# Patient Record
Sex: Female | Born: 1975 | Hispanic: Yes | Marital: Married | Smoking: Never smoker
Health system: Southern US, Community
[De-identification: ages and names within clinical notes are randomized; demographics above are authoritative.]

## PROBLEM LIST (undated history)

## (undated) DIAGNOSIS — IMO0002 Reserved for concepts with insufficient information to code with codable children: Secondary | ICD-10-CM

## (undated) DIAGNOSIS — O09529 Supervision of elderly multigravida, unspecified trimester: Secondary | ICD-10-CM

## (undated) DIAGNOSIS — I8390 Asymptomatic varicose veins of unspecified lower extremity: Secondary | ICD-10-CM

## (undated) DIAGNOSIS — M1990 Unspecified osteoarthritis, unspecified site: Secondary | ICD-10-CM

## (undated) DIAGNOSIS — E669 Obesity, unspecified: Secondary | ICD-10-CM

## (undated) DIAGNOSIS — E785 Hyperlipidemia, unspecified: Secondary | ICD-10-CM

## (undated) HISTORY — DX: Obesity, unspecified: E66.9

## (undated) HISTORY — DX: Asymptomatic varicose veins of unspecified lower extremity: I83.90

## (undated) HISTORY — DX: Reserved for concepts with insufficient information to code with codable children: IMO0002

## (undated) HISTORY — DX: Supervision of elderly multigravida, unspecified trimester: O09.529

---

## 2003-08-01 ENCOUNTER — Emergency Department (HOSPITAL_COMMUNITY): Admission: EM | Admit: 2003-08-01 | Discharge: 2003-08-01 | Payer: Self-pay | Admitting: Emergency Medicine

## 2003-12-14 ENCOUNTER — Encounter (INDEPENDENT_AMBULATORY_CARE_PROVIDER_SITE_OTHER): Payer: Self-pay | Admitting: *Deleted

## 2003-12-14 ENCOUNTER — Encounter: Admission: RE | Admit: 2003-12-14 | Discharge: 2003-12-14 | Payer: Self-pay | Admitting: Obstetrics and Gynecology

## 2003-12-14 ENCOUNTER — Other Ambulatory Visit: Admission: RE | Admit: 2003-12-14 | Discharge: 2003-12-14 | Payer: Self-pay | Admitting: Obstetrics & Gynecology

## 2003-12-14 ENCOUNTER — Encounter (INDEPENDENT_AMBULATORY_CARE_PROVIDER_SITE_OTHER): Payer: Self-pay | Admitting: Specialist

## 2004-07-07 ENCOUNTER — Encounter: Admission: RE | Admit: 2004-07-07 | Discharge: 2004-07-07 | Payer: Self-pay | Admitting: Family Medicine

## 2004-07-07 ENCOUNTER — Encounter (INDEPENDENT_AMBULATORY_CARE_PROVIDER_SITE_OTHER): Payer: Self-pay | Admitting: *Deleted

## 2005-05-05 ENCOUNTER — Inpatient Hospital Stay (HOSPITAL_COMMUNITY): Admission: AD | Admit: 2005-05-05 | Discharge: 2005-05-05 | Payer: Self-pay | Admitting: Obstetrics and Gynecology

## 2005-05-07 ENCOUNTER — Inpatient Hospital Stay (HOSPITAL_COMMUNITY): Admission: AD | Admit: 2005-05-07 | Discharge: 2005-05-07 | Payer: Self-pay | Admitting: *Deleted

## 2005-06-18 ENCOUNTER — Inpatient Hospital Stay (HOSPITAL_COMMUNITY): Admission: AD | Admit: 2005-06-18 | Discharge: 2005-06-18 | Payer: Self-pay | Admitting: Obstetrics & Gynecology

## 2005-09-04 ENCOUNTER — Ambulatory Visit (HOSPITAL_COMMUNITY): Admission: RE | Admit: 2005-09-04 | Discharge: 2005-09-04 | Payer: Self-pay | Admitting: Obstetrics and Gynecology

## 2005-11-28 ENCOUNTER — Ambulatory Visit (HOSPITAL_COMMUNITY): Admission: RE | Admit: 2005-11-28 | Discharge: 2005-11-28 | Payer: Self-pay | Admitting: Obstetrics and Gynecology

## 2006-01-03 ENCOUNTER — Ambulatory Visit: Payer: Self-pay | Admitting: Obstetrics & Gynecology

## 2006-01-09 ENCOUNTER — Ambulatory Visit: Payer: Self-pay | Admitting: Obstetrics & Gynecology

## 2006-01-09 ENCOUNTER — Inpatient Hospital Stay (HOSPITAL_COMMUNITY): Admission: AD | Admit: 2006-01-09 | Discharge: 2006-01-12 | Payer: Self-pay | Admitting: Obstetrics and Gynecology

## 2006-01-10 ENCOUNTER — Encounter (INDEPENDENT_AMBULATORY_CARE_PROVIDER_SITE_OTHER): Payer: Self-pay | Admitting: Specialist

## 2006-01-15 ENCOUNTER — Ambulatory Visit: Payer: Self-pay | Admitting: *Deleted

## 2007-06-12 ENCOUNTER — Emergency Department (HOSPITAL_COMMUNITY): Admission: EM | Admit: 2007-06-12 | Discharge: 2007-06-12 | Payer: Self-pay | Admitting: Emergency Medicine

## 2008-12-06 ENCOUNTER — Emergency Department (HOSPITAL_COMMUNITY): Admission: EM | Admit: 2008-12-06 | Discharge: 2008-12-06 | Payer: Self-pay | Admitting: Emergency Medicine

## 2011-04-06 NOTE — Op Note (Signed)
NAME:  Mindy Johnson, Mindy Johnson      ACCOUNT NO.:  [id number]   MEDICAL RECORD NO.:  [id number]          PATIENT TYPE:  INP   LOCATION:  9102                          FACILITY:  [facility]   PHYSICIAN:  Lesly Dukes, M.D. DATE OF BIRTH:  [birth year]   DATE OF PROCEDURE:  01/10/2006  DATE OF DISCHARGE:                                 OPERATIVE REPORT   PREOPERATIVE DIAGNOSES:  1.  Forty-one weeks and five days gestational age intrauterine pregnancy.  2.  Failure to progress.  3.  Non-reassuring tracing when on Pitocin.   POSTOPERATIVE DIAGNOSES:  1.  Forty-one weeks and five days gestational age intrauterine pregnancy.  2.  Failure to progress.  3.  Non-reassuring tracing when on Pitocin.   OPERATION/PROCEDURE:  Primary low flap transverse cesarean section.   SURGEON:  Lesly Dukes, M.D.   ASSISTANT:  Marc Morgans. Mayford Knife, M.D.   ESTIMATED BLOOD LOSS:  800 mL.   COMPLICATIONS:  None.   PATHOLOGY:  None.   FINDINGS:  Viable female infant, Apgars 9 at one minute and 9 at five  minutes, thin meconium, ROP presentation, grossly normal fallopian tube,  ovaries and uterus.  Weight was 8 pounds 9 ounces.  Arterial cord PH 7.28.   DESCRIPTION OF PROCEDURE:  After informed consent was obtained, the patient  was taken to the operating room where epidural anesthesia was adequate.  The  patient was placed in the dorsal supine position with a leftward tilt and  prepped and draped in the normal sterile fashion.  The Foley was already in  the bladder.   A Pfannenstiel skin incision was made with the scalpel and carried down to  the underlying layer of fascia.  The fascia was incised in the midline and  this incision was extended bilaterally with the Mayo scissors.  The superior  and inferior aspects of the fascial incision were grasped with Kocher  clamps, tented up, dissected off sharply and bluntly from underlying layers  of rectus muscles.  The rectus muscles were separated in the  midline.  The  peritoneum was identified and entered bluntly and this incision was extended  both superior and inferiorly with good visualization of the bladder.  The  bladder blade was inserted.  The vesicouterine peritoneum was identified,  tented up and entered sharply with the Metzenbaum scissors.  This incision  was extended bilaterally and the bladder flap was created digitally.  Bladder blade was reinserted.  The uterine incision was made in a transverse  fashion in the lower uterine segment.  This incision was extended  bilaterally bluntly.  The head was delivered without incident and the nose  and mouth were suctioned with the DeLee.  The rest of the baby's body  delivered easily.  The cord was clamped and cut and the baby was handed off  to the waiting pediatrician.  Cord blood was sent for type and screen and a  cord arterial blood gas was also sent.  Placenta delivered manually  spontaneously with three-vessel cord.  The uterus was cleared of all clots  and debris and closed with 0 Vicryl in a running locked fashion.  A second  suture was placed over some parts of the uterus for reinforcement and for  hemostasis.  The abdomen was copiously irrigated and the uterus was found to  be hemostatic off tension.  Rectus muscles and peritoneum were noted to be  hemostatic.  The fascia was closed with 0 Vicryl in a running fashion.  Good  hemostasis was noted.  The subcutaneous tissue was copiously irrigated and  found to be hemostatic.  The skin was closed with staples.  The patient  tolerated the procedure well.  Sponge, lab, instrument, and needle counts  correct x2.  The patient went to the recovery room in stable condition.           ______________________________  Lesly Dukes, M.D.     KHL/MEDQ  D:  01/10/2006  T:  01/11/2006  Job:  [id number]

## 2011-04-06 NOTE — Discharge Summary (Signed)
Mindy Johnson, Mindy Johnson      ACCOUNT NO.:  [id number]   MEDICAL RECORD NO.:  [id number]          PATIENT TYPE:  INP   LOCATION:  9102                          FACILITY:  [facility]   PHYSICIAN:  Benn Moulder, M.D.      DATE OF BIRTH:  1976/02/18   DATE OF ADMISSION:  01/09/2006  DATE OF DISCHARGE:  01/12/2006                                 DISCHARGE SUMMARY   ADMISSION DIAGNOSES:  1.  Intrauterine pregnancy at 79 and 1/[redacted] weeks gestational age.  2.  Induction of labor for post dates.   DISCHARGE DIAGNOSES:  1.  Intrauterine pregnancy at 41 and 5/7 weeks.  2.  Status post primary low-transverse cesarean section.  3.  Failure to progress.  4.  Non-reassuring tracing when on Pitocin.   OPERATIONS AND PROCEDURES:  A primary low-transverse cesarean section on  January 10, 2006.   LABORATORY DATA:  CBC on admission:  White blood cell count 12.7, hemoglobin  13.3 and hematocrit 39.7.  CBC on discharge:  White blood cell count 24.1,  hemoglobin 11.2, hematocrit 32.7 and platelet count 241.  RPR was  nonreactive.   HOSPITAL COURSE:  The patient is a 35 year old gravida 2, para 0-0-1-0, who  presented at 73 and 4/7 weeks for induction of labor secondary to post  dates.  Induction was started with Cytotec and Pitocin.  The patient did  develop fever and was started on ampicillin and gentamicin for  chorioamnionitis.  The patient's labor failed to progress and the infant  also developed non-reassuring fetal heart tones while on Pitocin, so the  patient was taken to the OR for primary low-transverse cesarean section.  A  viable female with Apgars of 9 at one minute and 9 at five minutes with  delivered via primary low-transverse cesarean section under epidural  anesthesia.  Estimated blood loss was 800 mL.  There were no complications.  A 3-vessel cord placenta was delivered manually.  Arterial cord pH was 7.28.  Mom is breastfeeding and received a Depo shot prior to discharge for  contraception.  She was maintained on gentamicin and clindamycin for 24  hours postoperatively, but was without fever postoperatively.  Mom is O  positive, rubella immune and was GBS negative.   DISCHARGE MEDICATIONS:  1.  Percocet 5/325, 1 p.o. q.6h. p.r.n. pain.  2.  Ibuprofen 600 mg p.o. q.6h. p.r.n. pain.  3.  Prenatal vitamins once daily while breastfeeding.   DISCHARGE INSTRUCTIONS:  The patient is to increase activity slowly.   DISCHARGE FOLLOWUP:  1.  The patient is to return to the MAU in 2-3 days for staple removal.  2.  The patient is to follow up in 6 weeks at [facility] for a routine      postpartum visit.      Benn Moulder, M.D.     MR/MEDQ  D:  01/12/2006  T:  01/12/2006  Job:  [id number]

## 2011-06-29 ENCOUNTER — Other Ambulatory Visit: Payer: Self-pay | Admitting: Family Medicine

## 2011-06-29 DIAGNOSIS — N6311 Unspecified lump in the right breast, upper outer quadrant: Secondary | ICD-10-CM

## 2011-07-05 ENCOUNTER — Ambulatory Visit
Admission: RE | Admit: 2011-07-05 | Discharge: 2011-07-05 | Disposition: A | Discharge status: Home / Self Care | Payer: Self-pay | Source: Ambulatory Visit | Attending: Family Medicine | Admitting: Family Medicine

## 2011-07-05 DIAGNOSIS — N6311 Unspecified lump in the right breast, upper outer quadrant: Secondary | ICD-10-CM

## 2011-09-03 LAB — POCT PREGNANCY, URINE
Operator id: 247071
Preg Test, Ur: NEGATIVE

## 2011-09-03 LAB — ACETAMINOPHEN LEVEL: Acetaminophen (Tylenol), Serum: <10 — ABNORMAL LOW

## 2012-01-17 ENCOUNTER — Other Ambulatory Visit: Payer: Self-pay

## 2012-01-17 ENCOUNTER — Encounter (HOSPITAL_COMMUNITY): Payer: Self-pay | Admitting: *Deleted

## 2012-01-17 ENCOUNTER — Emergency Department (INDEPENDENT_AMBULATORY_CARE_PROVIDER_SITE_OTHER)
Admission: EM | Admit: 2012-01-17 | Discharge: 2012-01-17 | Disposition: A | Discharge status: Home / Self Care | Payer: Self-pay | Source: Home / Self Care | Attending: Family Medicine | Admitting: Family Medicine

## 2012-01-17 DIAGNOSIS — R071 Chest pain on breathing: Secondary | ICD-10-CM

## 2012-01-17 DIAGNOSIS — R0789 Other chest pain: Secondary | ICD-10-CM

## 2012-01-17 MED ORDER — IBUPROFEN 800 MG PO TABS: 800.0000 mg | ORAL_TABLET | Freq: Three times a day (TID) | ORAL | Status: AC

## 2012-01-17 NOTE — ED Notes (Signed)
Pt  Reports   l  Sided  Chest  Pain   With  Shortness of breath   She  Reports  The  Pain is  Worse  When  She  Takes a  Deep  Breath        She  Is  Awake  As  Well as  Alert  And  Oriented        Skin is warm /  Dry          Speaking in complete  sentances       Reports  She  Has  Had  Similar  Episode  In past  Yet  Became    Worried

## 2012-01-17 NOTE — ED Provider Notes (Signed)
History     CSN: [id number]  Arrival date & time 01/17/12  1021   First MD Initiated Contact with Patient 01/17/12 1113      Chief Complaint  Patient presents with  . Chest Pain    (Consider location/radiation/quality/duration/timing/severity/associated sxs/prior treatment) HPI Comments: The patient reports awakened from sleep with left sided chest pain and pain into her left arm. No shortness of breath but pain increases with deep breathing . No cough or cold symptoms. No injury. No n/v. No treatment pta. No hx of heart or lung problems.   The history is provided by the patient.    History reviewed. No pertinent past medical history.  History reviewed. No pertinent past surgical history.  History reviewed. No pertinent family history.  History  Substance Use Topics  . Smoking status: Not on file  . Smokeless tobacco: Not on file  . Alcohol Use: No    OB History    Grav Para Term Preterm Abortions TAB SAB Ect Mult Living                  Review of Systems  Constitutional: Negative.   HENT: Negative.   Eyes: Negative.   Respiratory: Negative.   Cardiovascular: Positive for chest pain. Negative for palpitations and leg swelling.  Gastrointestinal: Negative.   Genitourinary: Negative.   Musculoskeletal: Negative.   Neurological: Negative.     Allergies  Review of patient's allergies indicates not on file.  Home Medications   Current Outpatient Rx  Name Route Sig Dispense Refill  . IBUPROFEN 800 MG PO TABS Oral Take 1 tablet (800 mg total) by mouth 3 (three) times daily. 21 tablet 0    BP 111/75  Pulse 76  Temp(Src) 97.9 F (36.6 C) (Oral)  Resp 14  SpO2 99%  Physical Exam  Nursing note and vitals reviewed. Constitutional: She is oriented to person, place, and time. She appears well-developed and well-nourished. No distress.  HENT:  Head: Normocephalic and atraumatic.  Mouth/Throat: No oropharyngeal exudate.  Neck: Normal range of motion. Neck  supple.  Cardiovascular: Normal rate and regular rhythm.        Chest wall pain to palpation both ant and post  Pulmonary/Chest: Effort normal and breath sounds normal.  Lymphadenopathy:    She has no cervical adenopathy.  Neurological: She is alert and oriented to person, place, and time.  Skin: Skin is warm and dry.    ED Course  Procedures (including critical care time)  Labs Reviewed - No data to display No results found.   1. Chest wall pain       MDM          Randa Spike, MD 01/17/12 [phone]

## 2012-12-23 ENCOUNTER — Inpatient Hospital Stay (HOSPITAL_COMMUNITY)
Admission: AD | Admit: 2012-12-23 | Discharge: 2012-12-23 | Disposition: A | Discharge status: Home / Self Care | Payer: Self-pay | Source: Ambulatory Visit | Attending: Family Medicine | Admitting: Family Medicine

## 2012-12-23 ENCOUNTER — Encounter (HOSPITAL_COMMUNITY): Payer: Self-pay

## 2012-12-23 DIAGNOSIS — N949 Unspecified condition associated with female genital organs and menstrual cycle: Secondary | ICD-10-CM

## 2012-12-23 DIAGNOSIS — O0930 Supervision of pregnancy with insufficient antenatal care, unspecified trimester: Secondary | ICD-10-CM | POA: Insufficient documentation

## 2012-12-23 DIAGNOSIS — O99891 Other specified diseases and conditions complicating pregnancy: Secondary | ICD-10-CM

## 2012-12-23 DIAGNOSIS — R42 Dizziness and giddiness: Secondary | ICD-10-CM | POA: Insufficient documentation

## 2012-12-23 DIAGNOSIS — K5900 Constipation, unspecified: Secondary | ICD-10-CM | POA: Insufficient documentation

## 2012-12-23 DIAGNOSIS — O210 Mild hyperemesis gravidarum: Secondary | ICD-10-CM

## 2012-12-23 DIAGNOSIS — M549 Dorsalgia, unspecified: Secondary | ICD-10-CM

## 2012-12-23 DIAGNOSIS — O219 Vomiting of pregnancy, unspecified: Secondary | ICD-10-CM

## 2012-12-23 HISTORY — DX: Hyperlipidemia, unspecified: E78.5

## 2012-12-23 LAB — URINALYSIS, ROUTINE W REFLEX MICROSCOPIC
Bilirubin Urine: NEGATIVE
Glucose, UA: NEGATIVE mg/dL
Hgb urine dipstick: NEGATIVE
Ketones, ur: NEGATIVE mg/dL
Leukocytes, UA: NEGATIVE
Nitrite: NEGATIVE
Protein, ur: NEGATIVE mg/dL
Specific Gravity, Urine: 1.015 (ref 1.005–1.030)
Urobilinogen, UA: 0.2 mg/dL (ref 0.0–1.0)
pH: 6 (ref 5.0–8.0)

## 2012-12-23 LAB — WET PREP, GENITAL: Yeast Wet Prep HPF POC: NONE SEEN

## 2012-12-23 MED ORDER — PROMETHAZINE HCL 12.5 MG PO TABS: 12.5000 mg | ORAL_TABLET | Freq: Four times a day (QID) | ORAL | Status: DC | PRN

## 2012-12-23 MED ORDER — ONDANSETRON HCL 4 MG PO TABS: 4.0000 mg | ORAL_TABLET | Freq: Four times a day (QID) | ORAL | Status: DC

## 2012-12-23 MED ORDER — ONDANSETRON 8 MG PO TBDP
8.0000 mg | ORAL_TABLET | Freq: Once | ORAL | Status: AC
  Administered 2012-12-23: 8 mg via ORAL
  Filled 2012-12-23: qty 1

## 2012-12-23 NOTE — MAU Note (Signed)
Pt states here for dizziness and pain since last pm, had trouble sleeping. Notes lower abd pain as well as leg pain, notes pain with voiding but no burning or pressure. Denies abnormal vaginal discharge or bleeding.

## 2012-12-23 NOTE — MAU Provider Note (Signed)
Chart reviewed and agree with management and plan.  

## 2012-12-23 NOTE — MAU Note (Signed)
Pt has appt on 2/11 with [facility].

## 2012-12-23 NOTE — MAU Provider Note (Signed)
History     CSN: [id number]  Arrival date and time: 12/23/12 1044   First Provider Initiated Contact with Patient 12/23/12 1224      Chief Complaint  Patient presents with  . Dizziness  . Abdominal Pain   HPI [name] is a 37 y.o. G2P1001 at [redacted]w[redacted]d who presents to MAU today with complaint of pelvic pain x 1 week and back pain that started yesterday. The patient states that the pelvic pain is intermittent. She also states that she is constipated. She has had N/V recently, the most recent episode of which was last night. She has some mild epigastric discomfort. She does have occasional dizziness as well. She denies UTI symptoms, vaginal bleeding, abnormal discharge or fever. She has an appointment to start prenatal care at [facility] on 01/01/13.   OB History    Grav Para Term Preterm Abortions TAB SAB Ect Mult Living   2 1 1       1       Past Medical History  Diagnosis Date  . Hyperlipidemia     Past Surgical History  Procedure Date  . Cesarean section     History reviewed. No pertinent family history.  History  Substance Use Topics  . Smoking status: Never Smoker   . Smokeless tobacco: Not on file  . Alcohol Use: No    Allergies: No Known Allergies  Prescriptions prior to admission  Medication Sig Dispense Refill  . Prenatal Vit-Fe Fumarate-FA (PRENATAL MULTIVITAMIN) TABS Take 1 tablet by mouth daily.        Review of Systems  Constitutional: Negative for fever and chills.  Respiratory: Negative for shortness of breath.   Cardiovascular: Negative for chest pain and palpitations.  Gastrointestinal: Positive for nausea, vomiting, abdominal pain and constipation. Negative for heartburn and diarrhea.  Genitourinary: Negative for dysuria, urgency and frequency.  Musculoskeletal: Positive for back pain.  Neurological: Positive for dizziness. Negative for tingling and headaches.   Physical Exam   Blood pressure 103/63, pulse 66, temperature 97.8 F (36.6 C),  temperature source Oral, resp. rate 16.  Physical Exam  Constitutional: She is oriented to person, place, and time. She appears well-developed and well-nourished. No distress.  HENT:  Head: Normocephalic.  Cardiovascular: Normal rate, regular rhythm and normal heart sounds.   Respiratory: Effort normal and breath sounds normal. No respiratory distress.  GI: Soft. Bowel sounds are normal. She exhibits no distension and no mass. There is tenderness (mild tenderness to palpation of the epigastric region and lower abdomen bilaterally). There is no rebound and no guarding.  Genitourinary: Vagina normal. Uterus is enlarged. Uterus is not tender. Cervix exhibits discharge (small amount of thin white discharge). Cervix exhibits no motion tenderness and no friability.  Neurological: She is alert and oriented to person, place, and time.  Skin: Skin is warm and dry. No erythema.  Psychiatric: She has a normal mood and affect.   Results for orders placed during the hospital encounter of 12/23/12 (from the past 24 hour(s))  URINALYSIS, ROUTINE W REFLEX MICROSCOPIC     Status: Normal   Collection Time   12/23/12 11:15 AM      Component Value Range   Color, Urine YELLOW  YELLOW   APPearance CLEAR  CLEAR   Specific Gravity, Urine 1.015  1.005 - 1.030   pH 6.0  5.0 - 8.0   Glucose, UA NEGATIVE  NEGATIVE mg/dL   Hgb urine dipstick NEGATIVE  NEGATIVE   Bilirubin Urine NEGATIVE  NEGATIVE  Ketones, ur NEGATIVE  NEGATIVE mg/dL   Protein, ur NEGATIVE  NEGATIVE mg/dL   Urobilinogen, UA 0.2  0.0 - 1.0 mg/dL   Nitrite NEGATIVE  NEGATIVE   Leukocytes, UA NEGATIVE  NEGATIVE  WET PREP, GENITAL     Status: Abnormal   Collection Time   12/23/12 12:34 PM      Component Value Range   Yeast Wet Prep HPF POC NONE SEEN  NONE SEEN   Trich, Wet Prep NONE SEEN  NONE SEEN   Clue Cells Wet Prep HPF POC NONE SEEN  NONE SEEN   WBC, Wet Prep HPF POC FEW (*) NONE SEEN    MAU Course  Procedures  MDM UA and Wet prep  obtained - WNL  Patient is most likely experiencing round ligament pain Epigastric tenderness is most likely due to N/V  Assessment and Plan  A: Nausea and vomiting in pregnancy Round ligament pain Late prenatal care  P: Discharge home Patient to keep appointment to start prenatal care at [facility] next week Tylenol as needed for pain; warm baths and heating pad may also be helpful for discomfort Rx for Phenergan and Zofran sent to patient's pharmacy Encouraged increased PO hydration as tolerated.  Patient may return to MAU as needed or if her condition were to change or worsen   Freddi Starr, PA-C 12/23/2012, 12:32 PM

## 2012-12-23 NOTE — Progress Notes (Signed)
Written and verbal d/c instructions given and understanding voiced. 

## 2012-12-24 LAB — GC/CHLAMYDIA PROBE AMP
CT Probe RNA: NEGATIVE
GC Probe RNA: NEGATIVE

## 2012-12-30 LAB — OB RESULTS CONSOLE RUBELLA ANTIBODY, IGM: Rubella: IMMUNE

## 2012-12-30 LAB — OB RESULTS CONSOLE ANTIBODY SCREEN: Antibody Screen: NEGATIVE

## 2012-12-30 LAB — OB RESULTS CONSOLE ABO/RH

## 2012-12-30 LAB — OB RESULTS CONSOLE GC/CHLAMYDIA: Gonorrhea: NEGATIVE

## 2012-12-31 ENCOUNTER — Other Ambulatory Visit (HOSPITAL_COMMUNITY): Payer: Self-pay | Admitting: Nurse Practitioner

## 2012-12-31 DIAGNOSIS — O3680X0 Pregnancy with inconclusive fetal viability, not applicable or unspecified: Secondary | ICD-10-CM

## 2013-01-06 ENCOUNTER — Ambulatory Visit (HOSPITAL_COMMUNITY)
Admission: RE | Admit: 2013-01-06 | Discharge: 2013-01-06 | Disposition: A | Discharge status: Home / Self Care | Payer: Self-pay | Source: Ambulatory Visit | Attending: Nurse Practitioner | Admitting: Nurse Practitioner

## 2013-01-06 ENCOUNTER — Other Ambulatory Visit (HOSPITAL_COMMUNITY): Payer: Self-pay | Admitting: Nurse Practitioner

## 2013-01-06 DIAGNOSIS — O3680X0 Pregnancy with inconclusive fetal viability, not applicable or unspecified: Secondary | ICD-10-CM

## 2013-01-06 DIAGNOSIS — Z3689 Encounter for other specified antenatal screening: Secondary | ICD-10-CM | POA: Insufficient documentation

## 2013-01-29 ENCOUNTER — Other Ambulatory Visit (HOSPITAL_COMMUNITY): Payer: Self-pay | Admitting: Nurse Practitioner

## 2013-02-20 ENCOUNTER — Ambulatory Visit (HOSPITAL_COMMUNITY)
Admission: RE | Admit: 2013-02-20 | Discharge: 2013-02-20 | Disposition: A | Discharge status: Home / Self Care | Payer: Self-pay | Source: Ambulatory Visit | Attending: Nurse Practitioner | Admitting: Nurse Practitioner

## 2013-02-20 DIAGNOSIS — Z3689 Encounter for other specified antenatal screening: Secondary | ICD-10-CM | POA: Insufficient documentation

## 2013-02-20 DIAGNOSIS — IMO0002 Reserved for concepts with insufficient information to code with codable children: Secondary | ICD-10-CM

## 2013-05-05 LAB — OB RESULTS CONSOLE GBS: GBS: NEGATIVE

## 2013-05-28 ENCOUNTER — Other Ambulatory Visit (HOSPITAL_COMMUNITY): Payer: Self-pay | Admitting: Physician Assistant

## 2013-05-28 DIAGNOSIS — O480 Post-term pregnancy: Secondary | ICD-10-CM

## 2013-06-01 ENCOUNTER — Ambulatory Visit (HOSPITAL_COMMUNITY)
Admission: RE | Admit: 2013-06-01 | Discharge: 2013-06-01 | Disposition: A | Discharge status: Home / Self Care | Payer: Self-pay | Source: Ambulatory Visit | Attending: Physician Assistant | Admitting: Physician Assistant

## 2013-06-01 DIAGNOSIS — O480 Post-term pregnancy: Secondary | ICD-10-CM

## 2013-06-01 DIAGNOSIS — O4100X0 Oligohydramnios, unspecified trimester, not applicable or unspecified: Secondary | ICD-10-CM | POA: Insufficient documentation

## 2013-06-02 ENCOUNTER — Encounter (HOSPITAL_COMMUNITY): Payer: Self-pay | Admitting: *Deleted

## 2013-06-02 ENCOUNTER — Telehealth (HOSPITAL_COMMUNITY): Payer: Self-pay | Admitting: *Deleted

## 2013-06-02 NOTE — Telephone Encounter (Signed)
Preadmission screen  

## 2013-06-05 ENCOUNTER — Inpatient Hospital Stay (HOSPITAL_COMMUNITY)
Admission: RE | Admit: 2013-06-05 | Discharge: 2013-06-10 | DRG: 765 | Disposition: A | Discharge status: Home / Self Care | Payer: Medicaid Other | Source: Ambulatory Visit | Attending: Obstetrics & Gynecology | Admitting: Obstetrics & Gynecology

## 2013-06-05 ENCOUNTER — Inpatient Hospital Stay (HOSPITAL_COMMUNITY): Payer: Self-pay

## 2013-06-05 ENCOUNTER — Encounter (HOSPITAL_COMMUNITY): Payer: Self-pay

## 2013-06-05 DIAGNOSIS — O411090 Infection of amniotic sac and membranes, unspecified, unspecified trimester, not applicable or unspecified: Secondary | ICD-10-CM

## 2013-06-05 DIAGNOSIS — O2200 Varicose veins of lower extremity in pregnancy, unspecified trimester: Secondary | ICD-10-CM | POA: Diagnosis present

## 2013-06-05 DIAGNOSIS — E669 Obesity, unspecified: Secondary | ICD-10-CM | POA: Diagnosis present

## 2013-06-05 DIAGNOSIS — O09529 Supervision of elderly multigravida, unspecified trimester: Secondary | ICD-10-CM | POA: Diagnosis present

## 2013-06-05 DIAGNOSIS — O480 Post-term pregnancy: Secondary | ICD-10-CM

## 2013-06-05 DIAGNOSIS — Z98891 History of uterine scar from previous surgery: Secondary | ICD-10-CM

## 2013-06-05 HISTORY — DX: Unspecified osteoarthritis, unspecified site: M19.90

## 2013-06-05 LAB — CBC
HCT: 35.2 % — ABNORMAL LOW (ref 36.0–46.0)
Platelets: 289 10*3/uL (ref 150–400)
RBC: 3.97 MIL/uL (ref 3.87–5.11)
RDW: 14.3 % (ref 11.5–15.5)
WBC: 15.1 10*3/uL — ABNORMAL HIGH (ref 4.0–10.5)

## 2013-06-05 LAB — TYPE AND SCREEN
ABO/RH(D): O POS
Antibody Screen: NEGATIVE

## 2013-06-05 MED ORDER — CITRIC ACID-SODIUM CITRATE 334-500 MG/5ML PO SOLN
30.0000 mL | ORAL | Status: DC | PRN
  Administered 2013-06-07: 30 mL via ORAL
  Filled 2013-06-05: qty 15

## 2013-06-05 MED ORDER — ACETAMINOPHEN 325 MG PO TABS
650.0000 mg | ORAL_TABLET | ORAL | Status: DC | PRN
  Administered 2013-06-06 – 2013-06-07 (×2): 650 mg via ORAL
  Filled 2013-06-05 (×2): qty 2

## 2013-06-05 MED ORDER — OXYTOCIN BOLUS FROM INFUSION: 500.0000 mL | INTRAVENOUS | Status: DC

## 2013-06-05 MED ORDER — LACTATED RINGERS IV SOLN
500.0000 mL | INTRAVENOUS | Status: DC | PRN
  Administered 2013-06-06 – 2013-06-07 (×5): 500 mL via INTRAVENOUS

## 2013-06-05 MED ORDER — LIDOCAINE HCL (PF) 1 % IJ SOLN: 30.0000 mL | INTRAMUSCULAR | Status: DC | PRN

## 2013-06-05 MED ORDER — OXYTOCIN 40 UNITS IN LACTATED RINGERS INFUSION - SIMPLE MED: 62.5000 mL/h | INTRAVENOUS | Status: DC

## 2013-06-05 MED ORDER — IBUPROFEN 600 MG PO TABS: 600.0000 mg | ORAL_TABLET | Freq: Four times a day (QID) | ORAL | Status: DC | PRN

## 2013-06-05 MED ORDER — FENTANYL CITRATE 0.05 MG/ML IJ SOLN
100.0000 ug | INTRAMUSCULAR | Status: DC | PRN
  Administered 2013-06-06 (×4): 100 ug via INTRAVENOUS
  Filled 2013-06-05 (×4): qty 2

## 2013-06-05 MED ORDER — OXYCODONE-ACETAMINOPHEN 5-325 MG PO TABS: 1.0000 | ORAL_TABLET | ORAL | Status: DC | PRN

## 2013-06-05 MED ORDER — ZOLPIDEM TARTRATE 5 MG PO TABS
5.0000 mg | ORAL_TABLET | Freq: Every evening | ORAL | Status: DC | PRN
  Administered 2013-06-05: 5 mg via ORAL
  Filled 2013-06-05: qty 1

## 2013-06-05 MED ORDER — ONDANSETRON HCL 4 MG/2ML IJ SOLN: 4.0000 mg | Freq: Four times a day (QID) | INTRAMUSCULAR | Status: DC | PRN

## 2013-06-05 MED ORDER — FLEET ENEMA 7-19 GM/118ML RE ENEM: 1.0000 | ENEMA | RECTAL | Status: DC | PRN

## 2013-06-05 MED ORDER — LACTATED RINGERS IV SOLN
INTRAVENOUS | Status: DC
  Administered 2013-06-05 – 2013-06-07 (×7): via INTRAVENOUS
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
## 2013-06-06 ENCOUNTER — Encounter (HOSPITAL_COMMUNITY): Payer: Self-pay | Admitting: Anesthesiology

## 2013-06-06 ENCOUNTER — Encounter (HOSPITAL_COMMUNITY): Payer: Self-pay

## 2013-06-06 ENCOUNTER — Inpatient Hospital Stay (HOSPITAL_COMMUNITY): Payer: Medicaid Other | Admitting: Anesthesiology

## 2013-06-06 MED ORDER — FENTANYL 2.5 MCG/ML BUPIVACAINE 1/10 % EPIDURAL INFUSION (WH - ANES)
14.0000 mL/h | INTRAMUSCULAR | Status: DC | PRN
  Administered 2013-06-06 – 2013-06-07 (×4): 14 mL/h via EPIDURAL
  Filled 2013-06-06 (×4): qty 125

## 2013-06-06 MED ORDER — EPHEDRINE 5 MG/ML INJ
10.0000 mg | INTRAVENOUS | Status: DC | PRN
  Filled 2013-06-06: qty 4

## 2013-06-06 MED ORDER — PHENYLEPHRINE 40 MCG/ML (10ML) SYRINGE FOR IV PUSH (FOR BLOOD PRESSURE SUPPORT)
80.0000 ug | PREFILLED_SYRINGE | INTRAVENOUS | Status: DC | PRN
  Filled 2013-06-06: qty 5

## 2013-06-06 MED ORDER — LACTATED RINGERS IV SOLN
500.0000 mL | Freq: Once | INTRAVENOUS | Status: AC
  Administered 2013-06-06: 500 mL via INTRAVENOUS

## 2013-06-06 MED ORDER — PHENYLEPHRINE 40 MCG/ML (10ML) SYRINGE FOR IV PUSH (FOR BLOOD PRESSURE SUPPORT): 80.0000 ug | PREFILLED_SYRINGE | INTRAVENOUS | Status: DC | PRN

## 2013-06-06 MED ORDER — OXYTOCIN 40 UNITS IN LACTATED RINGERS INFUSION - SIMPLE MED
1.0000 m[IU]/min | INTRAVENOUS | Status: DC
  Administered 2013-06-06: 2 m[IU]/min via INTRAVENOUS
  Filled 2013-06-06: qty 1000

## 2013-06-06 MED ORDER — SODIUM BICARBONATE 8.4 % IV SOLN
INTRAVENOUS | Status: DC | PRN
  Administered 2013-06-06: 5 mL via EPIDURAL

## 2013-06-06 MED ORDER — TERBUTALINE SULFATE 1 MG/ML IJ SOLN: 0.2500 mg | Freq: Once | INTRAMUSCULAR | Status: AC | PRN

## 2013-06-06 MED ORDER — DIPHENHYDRAMINE HCL 50 MG/ML IJ SOLN: 12.5000 mg | INTRAMUSCULAR | Status: DC | PRN

## 2013-06-06 MED ORDER — EPHEDRINE 5 MG/ML INJ: 10.0000 mg | INTRAVENOUS | Status: DC | PRN

## 2013-06-06 NOTE — Anesthesia Preprocedure Evaluation (Addendum)
Anesthesia Evaluation  Patient identified by MRN, date of birth, ID band Patient awake    Reviewed: Allergy & Precautions, H&P , Patient's Chart, lab work & pertinent test results  Airway Mallampati: II TM Distance: >3 FB Neck ROM: full    Dental  (+) Teeth Intact   Pulmonary  breath sounds clear to auscultation        Cardiovascular Rhythm:regular Rate:Normal     Neuro/Psych    GI/Hepatic   Endo/Other  Morbid obesity  Renal/GU      Musculoskeletal   Abdominal   Peds  Hematology   Anesthesia Other Findings       Reproductive/Obstetrics (+) Pregnancy (h/o prior c/s for FTP, attempting VBAC, failed VBAC -> FTP)                          Anesthesia Physical Anesthesia Plan  ASA: III and emergent  Anesthesia Plan: Epidural   Post-op Pain Management:    Induction:   Airway Management Planned:   Additional Equipment:   Intra-op Plan:   Post-operative Plan:   Informed Consent: I have reviewed the patients History and Physical, chart, labs and discussed the procedure including the risks, benefits and alternatives for the proposed anesthesia with the patient or authorized representative who has indicated his/her understanding and acceptance.   Dental Advisory Given  Plan Discussed with: Surgeon and CRNA  Anesthesia Plan Comments: (Labs checked- platelets confirmed with RN in room. Fetal heart tracing, per RN, reported to be stable enough for sitting procedure. Discussed epidural, and patient consents to the procedure:  included risk of possible headache,backache, failed block, allergic reaction, and nerve injury. This patient was asked if she had any questions or concerns before the procedure started. )       Anesthesia Quick Evaluation

## 2013-06-06 NOTE — Progress Notes (Signed)
ZSOFIA PROUT is a 37 y.o. G3P1011 at [redacted]w[redacted]d admitted for IOL postdates  Subjective: Comfortable w/ epidural, no complaints  Objective: BP 118/66  Pulse 81  Temp(Src) 97.8 F (36.6 C) (Oral)  Resp 20  Ht 5\' 1"  (1.549 m)  Wt 95.255 kg (210 lb)  BMI 39.7 kg/m2  SpO2 100%  LMP 08/19/2012 I/O last 3 completed shifts: In: -  Out: 700 [Urine:700]    FHT:  FHR: 145 bpm, variability: moderate,  accelerations:  Abscent,  decelerations:  Present variables, lates UC:   regular, every 2-3 minutes SVE:   Dilation: 7 Effacement (%): 90 Station: 0 Exam by:: K. Esther Broyles CNM No membranes felt, able to feel fetal hair, no lof per pt/nurse, unknown date/time of srom Pos scalp stim  Labs: Lab Results  Component Value Date   WBC 15.1* 06/05/2013   HGB 12.0 06/05/2013   HCT 35.2* 06/05/2013   MCV 88.7 06/05/2013   PLT 289 06/05/2013    Assessment / Plan: IOL d/t postdates, progressing well on 52mu/min pitocin, recurrent variables, late at times, improved w/ fluid bolus, position changes. Pos scalp stim  Labor: Progressing normally Preeclampsia:  n/a Fetal Wellbeing:  Category II Pain Control:  Epidural I/D:  n/a Anticipated MOD:  VBAC   [name] 06/06/2013, 7:10 PM

## 2013-06-06 NOTE — Progress Notes (Signed)
Mindy Johnson is a 37 y.o. G3P1011 at [redacted]w[redacted]d admitted for induction of labor due to Post dates. Due date 05/26/13.  Subjective: Patient doing okay, received fentanyl dose for pain. Has some cramping from the foley bulb.  Objective: BP 93/56  Pulse 86  Temp(Src) 98.3 F (36.8 C) (Oral)  Resp 18  Ht 5\' 1"  (1.549 m)  Wt 95.255 kg (210 lb)  BMI 39.7 kg/m2  LMP 08/19/2012      FHT:  FHR: 130 bpm, variability: moderate,  accelerations:  Present,  decelerations:  Absent UC:   regular, every 4 minutes SVE:   Dilation: 1.5 Effacement (%): 60 Station: -2 Exam by:: [name] CNM  Labs: Lab Results  Component Value Date   WBC 15.1* 06/05/2013   HGB 12.0 06/05/2013   HCT 35.2* 06/05/2013   MCV 88.7 06/05/2013   PLT 289 06/05/2013    Assessment / Plan: Induction of labor due to postterm Foley bulb in  Labor: foley bulb in Fetal Wellbeing:  Category I Pain Control:  Fentanyl Anticipated MOD:  TOLAC  [name] 06/06/2013, 3:00 AM

## 2013-06-06 NOTE — Progress Notes (Signed)
RYDER CHESMORE is a 37 y.o. G3P1011 at [redacted]w[redacted]d admitted for induction of labor due to postdates.  Subjective: Comfortable w/ epidural, feeling some pressure  Objective: BP 112/83  Pulse 84  Temp(Src) 97.7 F (36.5 C) (Oral)  Resp 20  Ht 5\' 1"  (1.549 m)  Wt 95.255 kg (210 lb)  BMI 39.7 kg/m2  SpO2 100%  LMP 08/19/2012 I/O last 3 completed shifts: In: -  Out: 700 [Urine:700]    FHT:  FHR: 140 bpm, variability: moderate,  accelerations:  Abscent,  decelerations:  Present earlies UC:   regular, every 2-4 minutes SVE:   Dilation: 7 Effacement (%): 90 Station: +1 Exam by:: Genella Rife CNM Pos scalp stim  Labs: Lab Results  Component Value Date   WBC 15.1* 06/05/2013   HGB 12.0 06/05/2013   HCT 35.2* 06/05/2013   MCV 88.7 06/05/2013   PLT 289 06/05/2013    Assessment / Plan: IOL d/t postdates, pitocin had to be turned off earlier d/t decels- now at 74mu/min  Labor: Progressing normally Preeclampsia:  n/a Fetal Wellbeing:  Category I Pain Control:  Epidural I/D:  n/a Anticipated MOD:  NSVD  [name] 06/06/2013, 9:59 PM

## 2013-06-06 NOTE — Progress Notes (Signed)
Mindy Johnson is a 37 y.o. G3P1011 at [redacted]w[redacted]d admitted for induction of labor due to postdates.  Subjective: Does feel uc's, has had multiple doses of iv pain meds, foley bulb out now at 1106  Objective: BP 116/67  Pulse 76  Temp(Src) 97.9 F (36.6 C) (Oral)  Resp 18  Ht 5\' 1"  (1.549 m)  Wt 95.255 kg (210 lb)  BMI 39.7 kg/m2  LMP 08/19/2012      FHT:  FHR: 125 bpm, variability: moderate,  accelerations:  Present,  decelerations:  Absent UC:   irregular, every 3-9 minutes SVE:   Uterine balloon of cook catheter deflated, uterine balloon did fal out w/ gentle traction           Dilation: 5 Effacement (%): 50 Cervical Position: Middle Station: -2 Presentation: Vertex Exam by:: Dr. Johnn Hai   Labs: Lab Results  Component Value Date   WBC 15.1* 06/05/2013   HGB 12.0 06/05/2013   HCT 35.2* 06/05/2013   MCV 88.7 06/05/2013   PLT 289 06/05/2013    Assessment / Plan: IOL d/t postdates, cervical ripening phase S/p cook bulb IOL: Starting pitocin low dose Preeclampsia:  n/a Fetal Wellbeing:  Category I Pain Control:  Fentanyl, requesting epidural as needed I/D:  n/a Anticipated MOD:  VBAC   Mindy Johnson, Mindy Johnson 06/06/2013, 11:08 AM

## 2013-06-06 NOTE — Progress Notes (Signed)
Mindy Johnson is a 37 y.o. G3P1011 at [redacted]w[redacted]d admitted for induction of labor due to postdates.  Subjective: Does feel uc's, has had multiple doses of iv pain meds  Objective: BP 122/70  Pulse 73  Temp(Src) 97.9 F (36.6 C) (Oral)  Resp 18  Ht 5\' 1"  (1.549 m)  Wt 95.255 kg (210 lb)  BMI 39.7 kg/m2  LMP 08/19/2012      FHT:  FHR: 125 bpm, variability: moderate,  accelerations:  Present,  decelerations:  Absent UC:   irregular, every 3-9 minutes SVE:   Vaginal balloon of cook catheter deflated, uterine balloon did not fall out w/ gentle traction            Exam around balloon: ~3cm, balloon still in place. Tubing taped to leg w/ gentle traction    Labs: Lab Results  Component Value Date   WBC 15.1* 06/05/2013   HGB 12.0 06/05/2013   HCT 35.2* 06/05/2013   MCV 88.7 06/05/2013   PLT 289 06/05/2013    Assessment / Plan: IOL d/t postdates, cervical ripening phase  Labor: n/a Preeclampsia:  n/a Fetal Wellbeing:  Category I Pain Control:  Fentanyl I/D:  n/a Anticipated MOD:  VBAC  [name] 06/06/2013, 8:56 AM

## 2013-06-06 NOTE — Progress Notes (Addendum)
Mindy Johnson is a 37 y.o. G3P1011 at [redacted]w[redacted]d  admitted for induction of labor due to Post dates. Due date 05/26/13.  Addend: Note originally written for wrong patient. Corrected 0650 AMW  Subjective: Patient sleeping. In a little bit of pain, medications helping.  Objective: BP 108/61  Pulse 84  Temp(Src) 98.3 F (36.8 C) (Oral)  Resp 18  Ht 5\' 1"  (1.549 m)  Wt 95.255 kg (210 lb)  BMI 39.7 kg/m2  LMP 08/19/2012      FHT:  FHR: 130 bpm, variability: moderate,  accelerations:  Present,  decelerations:  Absent UC:   regular, every 4 minutes SVE:   Dilation: 1.5 Effacement (%): 60 Station: -2 Presentation: Vertex Exam by:: [name] CNM    Labs: Lab Results  Component Value Date   WBC 15.1* 06/05/2013   HGB 12.0 06/05/2013   HCT 35.2* 06/05/2013   MCV 88.7 06/05/2013   PLT 289 06/05/2013    Assessment / Plan: Induction of labor due to postterm Foley bulb still in  Labor: Progressing normally Fetal Wellbeing:  Category II Pain Control:  Fentanyl Anticipated MOD:  TOLAC  Mindy Johnson 06/06/2013, 6:42 AM

## 2013-06-06 NOTE — Progress Notes (Signed)
GERILYNN MCCULLARS is a 37 y.o. G3P1011 at [redacted]w[redacted]d admitted for induction of labor due to postdates.  Subjective: Called by nurse to review strip. Recurrent variables, pt repositioned and fluid bolus started. Dec pit from 8 to 6 mU with improvement of varibale decels.  Objective: BP 107/47  Pulse 88  Temp(Src) 97.9 F (36.6 C) (Oral)  Resp 18  Ht 5\' 1"  (1.549 m)  Wt 95.255 kg (210 lb)  BMI 39.7 kg/m2  SpO2 100%  LMP 08/19/2012      FHT:  FHR: 145 bpm, variability: moderate,  accelerations:  absent,  decelerations: recurrent variable decels to 110 for ~60s with quick recovery. Improved with interventions  UC:   irregular, every 2-3 minutes SVE:   Repeat exam by nursing           Dilation: 5 Effacement (%): 60 Cervical Position: Middle;Anterior Station: -2 Presentation: Vertex Exam by:: Lorretta Harp RNC   Labs: Lab Results  Component Value Date   WBC 15.1* 06/05/2013   HGB 12.0 06/05/2013   HCT 35.2* 06/05/2013   MCV 88.7 06/05/2013   PLT 289 06/05/2013    Assessment / Plan: IOL d/t postdates, cervical ripening phase S/p cook bulb, on pitcoing IOL: Starting pitocin low dose, hold pitocin at 6 for 1 hr. If continues to appear adequate, will not increase, consider amniotomy if no change at next check. Preeclampsia:  n/a Fetal Wellbeing:  Cat II: will continue to monitor. Pain Control: s/p epidural excellent pain control. I/D:  n/a Anticipated MOD:  VBAC   Aleza Pew, [name] 06/06/2013, 3:42 PM

## 2013-06-06 NOTE — Anesthesia Procedure Notes (Signed)

## 2013-06-07 ENCOUNTER — Encounter (HOSPITAL_COMMUNITY): Payer: Self-pay

## 2013-06-07 ENCOUNTER — Encounter (HOSPITAL_COMMUNITY)
Admission: RE | Disposition: A | Discharge status: Home / Self Care | Payer: Self-pay | Source: Ambulatory Visit | Attending: Obstetrics & Gynecology

## 2013-06-07 LAB — CBC
Platelets: 257 10*3/uL (ref 150–400)
RBC: 3.99 MIL/uL (ref 3.87–5.11)
WBC: 27.8 10*3/uL — ABNORMAL HIGH (ref 4.0–10.5)

## 2013-06-07 SURGERY — Surgical Case
Anesthesia: Epidural | Site: Abdomen | Wound class: Clean Contaminated

## 2013-06-07 MED ORDER — NALOXONE HCL 0.4 MG/ML IJ SOLN: 0.4000 mg | INTRAMUSCULAR | Status: DC | PRN

## 2013-06-07 MED ORDER — SENNOSIDES-DOCUSATE SODIUM 8.6-50 MG PO TABS
2.0000 | ORAL_TABLET | Freq: Every day | ORAL | Status: DC
  Administered 2013-06-07 – 2013-06-09 (×3): 2 via ORAL

## 2013-06-07 MED ORDER — FENTANYL CITRATE 0.05 MG/ML IJ SOLN
INTRAMUSCULAR | Status: DC | PRN
  Administered 2013-06-07 (×2): 50 ug via INTRAVENOUS

## 2013-06-07 MED ORDER — GENTAMICIN SULFATE 40 MG/ML IJ SOLN
160.0000 mg | Freq: Three times a day (TID) | INTRAVENOUS | Status: DC
  Administered 2013-06-07: 160 mg via INTRAVENOUS
  Filled 2013-06-07 (×2): qty 4

## 2013-06-07 MED ORDER — 0.9 % SODIUM CHLORIDE (POUR BTL) OPTIME
TOPICAL | Status: DC | PRN
  Administered 2013-06-07: 200 mL
  Administered 2013-06-07: 500 mL

## 2013-06-07 MED ORDER — LACTATED RINGERS IV SOLN
40.0000 [IU] | INTRAVENOUS | Status: DC | PRN
  Administered 2013-06-07: 40 [IU] via INTRAVENOUS

## 2013-06-07 MED ORDER — DIPHENHYDRAMINE HCL 25 MG PO CAPS
25.0000 mg | ORAL_CAPSULE | Freq: Four times a day (QID) | ORAL | Status: DC | PRN
  Filled 2013-06-07: qty 1

## 2013-06-07 MED ORDER — ONDANSETRON HCL 4 MG/2ML IJ SOLN: 4.0000 mg | INTRAMUSCULAR | Status: DC | PRN

## 2013-06-07 MED ORDER — KETOROLAC TROMETHAMINE 60 MG/2ML IM SOLN
INTRAMUSCULAR | Status: AC
  Filled 2013-06-07: qty 2

## 2013-06-07 MED ORDER — SCOPOLAMINE 1 MG/3DAYS TD PT72
1.0000 | MEDICATED_PATCH | Freq: Once | TRANSDERMAL | Status: AC
  Administered 2013-06-07: 1.5 mg via TRANSDERMAL

## 2013-06-07 MED ORDER — LACTATED RINGERS IV SOLN
INTRAVENOUS | Status: DC
  Administered 2013-06-07 (×2): via INTRAVENOUS

## 2013-06-07 MED ORDER — MORPHINE SULFATE (PF) 0.5 MG/ML IJ SOLN
INTRAMUSCULAR | Status: DC | PRN
  Administered 2013-06-07: 4 mg via EPIDURAL

## 2013-06-07 MED ORDER — NALBUPHINE HCL 10 MG/ML IJ SOLN
5.0000 mg | INTRAMUSCULAR | Status: DC | PRN
  Filled 2013-06-07: qty 1

## 2013-06-07 MED ORDER — IBUPROFEN 600 MG PO TABS
600.0000 mg | ORAL_TABLET | Freq: Four times a day (QID) | ORAL | Status: DC
  Administered 2013-06-07 – 2013-06-10 (×11): 600 mg via ORAL
  Filled 2013-06-07 (×11): qty 1

## 2013-06-07 MED ORDER — OXYTOCIN 40 UNITS IN LACTATED RINGERS INFUSION - SIMPLE MED: 1.0000 m[IU]/min | INTRAVENOUS | Status: DC

## 2013-06-07 MED ORDER — FENTANYL CITRATE 0.05 MG/ML IJ SOLN
INTRAMUSCULAR | Status: AC
  Filled 2013-06-07: qty 2

## 2013-06-07 MED ORDER — MENTHOL 3 MG MT LOZG
1.0000 | LOZENGE | OROMUCOSAL | Status: DC | PRN
  Filled 2013-06-07: qty 9

## 2013-06-07 MED ORDER — DIPHENHYDRAMINE HCL 50 MG/ML IJ SOLN: 25.0000 mg | INTRAMUSCULAR | Status: DC | PRN

## 2013-06-07 MED ORDER — DIBUCAINE 1 % RE OINT
1.0000 "application " | TOPICAL_OINTMENT | RECTAL | Status: DC | PRN
  Filled 2013-06-07: qty 28

## 2013-06-07 MED ORDER — SCOPOLAMINE 1 MG/3DAYS TD PT72
MEDICATED_PATCH | TRANSDERMAL | Status: AC
  Filled 2013-06-07: qty 1

## 2013-06-07 MED ORDER — PRENATAL MULTIVITAMIN CH
1.0000 | ORAL_TABLET | Freq: Every day | ORAL | Status: DC
  Administered 2013-06-08 – 2013-06-09 (×2): 1 via ORAL
  Filled 2013-06-07 (×3): qty 1

## 2013-06-07 MED ORDER — KETOROLAC TROMETHAMINE 60 MG/2ML IM SOLN
60.0000 mg | Freq: Once | INTRAMUSCULAR | Status: AC | PRN
  Administered 2013-06-07: 60 mg via INTRAMUSCULAR

## 2013-06-07 MED ORDER — OXYCODONE-ACETAMINOPHEN 5-325 MG PO TABS
1.0000 | ORAL_TABLET | ORAL | Status: DC | PRN
  Administered 2013-06-08 – 2013-06-10 (×4): 1 via ORAL
  Filled 2013-06-07 (×5): qty 1

## 2013-06-07 MED ORDER — FENTANYL CITRATE 0.05 MG/ML IJ SOLN: 25.0000 ug | INTRAMUSCULAR | Status: DC | PRN

## 2013-06-07 MED ORDER — ONDANSETRON HCL 4 MG/2ML IJ SOLN
INTRAMUSCULAR | Status: AC
  Filled 2013-06-07: qty 2

## 2013-06-07 MED ORDER — KETOROLAC TROMETHAMINE 30 MG/ML IJ SOLN: 30.0000 mg | Freq: Four times a day (QID) | INTRAMUSCULAR | Status: AC | PRN

## 2013-06-07 MED ORDER — OXYTOCIN 10 UNIT/ML IJ SOLN
INTRAMUSCULAR | Status: AC
  Filled 2013-06-07: qty 4

## 2013-06-07 MED ORDER — MORPHINE SULFATE (PF) 0.5 MG/ML IJ SOLN
INTRAMUSCULAR | Status: DC | PRN
  Administered 2013-06-07: 1 mg via EPIDURAL

## 2013-06-07 MED ORDER — NALBUPHINE HCL 10 MG/ML IJ SOLN
5.0000 mg | INTRAMUSCULAR | Status: DC | PRN
Start: 2013-06-07 — End: 2013-06-10
  Filled 2013-06-07: qty 1

## 2013-06-07 MED ORDER — SIMETHICONE 80 MG PO CHEW
80.0000 mg | CHEWABLE_TABLET | Freq: Three times a day (TID) | ORAL | Status: DC
  Administered 2013-06-08 – 2013-06-10 (×9): 80 mg via ORAL

## 2013-06-07 MED ORDER — SIMETHICONE 80 MG PO CHEW
80.0000 mg | CHEWABLE_TABLET | ORAL | Status: DC | PRN
  Administered 2013-06-07: 80 mg via ORAL

## 2013-06-07 MED ORDER — MEPERIDINE HCL 25 MG/ML IJ SOLN
6.2500 mg | INTRAMUSCULAR | Status: AC | PRN
  Administered 2013-06-07 (×2): 6.25 mg via INTRAVENOUS

## 2013-06-07 MED ORDER — DIPHENHYDRAMINE HCL 25 MG PO CAPS
25.0000 mg | ORAL_CAPSULE | ORAL | Status: DC | PRN
  Filled 2013-06-07: qty 1

## 2013-06-07 MED ORDER — ONDANSETRON HCL 4 MG/2ML IJ SOLN
INTRAMUSCULAR | Status: DC | PRN
  Administered 2013-06-07: 4 mg via INTRAVENOUS

## 2013-06-07 MED ORDER — MEPERIDINE HCL 25 MG/ML IJ SOLN
INTRAMUSCULAR | Status: AC
  Filled 2013-06-07: qty 1

## 2013-06-07 MED ORDER — OXYTOCIN 40 UNITS IN LACTATED RINGERS INFUSION - SIMPLE MED: 62.5000 mL/h | INTRAVENOUS | Status: AC

## 2013-06-07 MED ORDER — WITCH HAZEL-GLYCERIN EX PADS: 1.0000 "application " | MEDICATED_PAD | CUTANEOUS | Status: DC | PRN

## 2013-06-07 MED ORDER — LACTATED RINGERS IV SOLN
INTRAVENOUS | Status: DC | PRN
  Administered 2013-06-07: 04:00:00 via INTRAVENOUS

## 2013-06-07 MED ORDER — LANOLIN HYDROUS EX OINT: 1.0000 "application " | TOPICAL_OINTMENT | CUTANEOUS | Status: DC | PRN

## 2013-06-07 MED ORDER — TETANUS-DIPHTH-ACELL PERTUSSIS 5-2.5-18.5 LF-MCG/0.5 IM SUSP
0.5000 mL | Freq: Once | INTRAMUSCULAR | Status: DC
  Filled 2013-06-07: qty 0.5

## 2013-06-07 MED ORDER — ONDANSETRON HCL 4 MG PO TABS: 4.0000 mg | ORAL_TABLET | ORAL | Status: DC | PRN

## 2013-06-07 MED ORDER — ONDANSETRON HCL 4 MG/2ML IJ SOLN: 4.0000 mg | Freq: Three times a day (TID) | INTRAMUSCULAR | Status: DC | PRN

## 2013-06-07 MED ORDER — ACETAMINOPHEN 500 MG PO TABS: 1000.0000 mg | ORAL_TABLET | Freq: Once | ORAL | Status: DC

## 2013-06-07 MED ORDER — SODIUM CHLORIDE 0.9 % IJ SOLN: 3.0000 mL | INTRAMUSCULAR | Status: DC | PRN

## 2013-06-07 MED ORDER — CEFAZOLIN SODIUM-DEXTROSE 2-3 GM-% IV SOLR
2.0000 g | Freq: Once | INTRAVENOUS | Status: AC
  Administered 2013-06-07: 2 g via INTRAVENOUS
  Filled 2013-06-07: qty 50

## 2013-06-07 MED ORDER — NALOXONE HCL 1 MG/ML IJ SOLN
1.0000 ug/kg/h | INTRAVENOUS | Status: DC | PRN
  Filled 2013-06-07: qty 2

## 2013-06-07 MED ORDER — DIPHENHYDRAMINE HCL 50 MG/ML IJ SOLN: 12.5000 mg | INTRAMUSCULAR | Status: DC | PRN

## 2013-06-07 MED ORDER — SODIUM BICARBONATE 8.4 % IV SOLN
INTRAVENOUS | Status: AC
  Filled 2013-06-07: qty 50

## 2013-06-07 MED ORDER — MORPHINE SULFATE 0.5 MG/ML IJ SOLN
INTRAMUSCULAR | Status: AC
  Filled 2013-06-07: qty 10

## 2013-06-07 MED ORDER — LIDOCAINE-EPINEPHRINE (PF) 2 %-1:200000 IJ SOLN
INTRAMUSCULAR | Status: AC
  Filled 2013-06-07: qty 20

## 2013-06-07 MED ORDER — SODIUM CHLORIDE 0.9 % IV SOLN
2.0000 g | Freq: Four times a day (QID) | INTRAVENOUS | Status: DC
  Administered 2013-06-07: 2 g via INTRAVENOUS
  Filled 2013-06-07 (×3): qty 2000

## 2013-06-07 MED ORDER — METOCLOPRAMIDE HCL 5 MG/ML IJ SOLN: 10.0000 mg | Freq: Three times a day (TID) | INTRAMUSCULAR | Status: DC | PRN

## 2013-06-07 SURGICAL SUPPLY — 32 items
BRR ADH 6X5 SEPRAFILM 1 SHT (MISCELLANEOUS)
CLAMP CORD UMBIL (MISCELLANEOUS) IMPLANT
CONTAINER PREFILL 10% NBF 15ML (MISCELLANEOUS) IMPLANT
DRAPE LG THREE QUARTER DISP (DRAPES) ×2 IMPLANT
DRSG OPSITE POSTOP 4X10 (GAUZE/BANDAGES/DRESSINGS) ×2 IMPLANT
DURAPREP 26ML APPLICATOR (WOUND CARE) ×2 IMPLANT
ELECT REM PT RETURN 9FT ADLT (ELECTROSURGICAL) ×2
ELECTRODE REM PT RTRN 9FT ADLT (ELECTROSURGICAL) ×1 IMPLANT
EXTRACTOR VACUUM KIWI (MISCELLANEOUS) ×1 IMPLANT
EXTRACTOR VACUUM M CUP 4 TUBE (SUCTIONS) IMPLANT
GLOVE BIOGEL PI IND STRL 6.5 (GLOVE) ×1 IMPLANT
GLOVE BIOGEL PI INDICATOR 6.5 (GLOVE) ×1
GLOVE SURG SS PI 6.0 STRL IVOR (GLOVE) ×2 IMPLANT
GOWN STRL REIN XL XLG (GOWN DISPOSABLE) ×4 IMPLANT
KIT ABG SYR 3ML LUER SLIP (SYRINGE) ×1 IMPLANT
NDL HYPO 25X5/8 SAFETYGLIDE (NEEDLE) IMPLANT
NEEDLE HYPO 25X5/8 SAFETYGLIDE (NEEDLE) ×2 IMPLANT
NS IRRIG 1000ML POUR BTL (IV SOLUTION) ×2 IMPLANT
PACK C SECTION WH (CUSTOM PROCEDURE TRAY) ×2 IMPLANT
PAD ABD 7.5X8 STRL (GAUZE/BANDAGES/DRESSINGS) ×1 IMPLANT
PAD OB MATERNITY 4.3X12.25 (PERSONAL CARE ITEMS) ×2 IMPLANT
RTRCTR C-SECT PINK 25CM LRG (MISCELLANEOUS) IMPLANT
SEPRAFILM MEMBRANE 5X6 (MISCELLANEOUS) IMPLANT
STAPLER VISISTAT 35W (STAPLE) IMPLANT
SUT PLAIN 0 NONE (SUTURE) IMPLANT
SUT PLAIN 2 0 XLH (SUTURE) ×1 IMPLANT
SUT VIC AB 0 CT1 36 (SUTURE) ×8 IMPLANT
SUT VIC AB 4-0 KS 27 (SUTURE) ×2 IMPLANT
TAPE CLOTH SURG 4X10 WHT LF (GAUZE/BANDAGES/DRESSINGS) ×1 IMPLANT
TOWEL OR 17X24 6PK STRL BLUE (TOWEL DISPOSABLE) ×6 IMPLANT
TRAY FOLEY CATH 14FR (SET/KITS/TRAYS/PACK) ×1 IMPLANT
WATER STERILE IRR 1000ML POUR (IV SOLUTION) ×2 IMPLANT

## 2013-06-07 NOTE — Progress Notes (Signed)
Patient ID: Mindy Johnson, female   DOB: [birth year], 37 y.o.   MRN: [id number] I came in to discuss current plan of care with patient. Patient without cervical change since 7 pm on 7/19. IUPC demonstrates inadequate contraction strength. Inability to persistently augment labor with pitocin secondary to presence of late deceleration.   FHT: baseline 150, min-mod variability, no accels, positive occasional variable decels and occasional late decelerations Toco: contraction q 2-4 minutes  Patient currently uncomfortable despite epidural. Discussed that fetal status is currently category II and we may still continue to pursue pitocin augmentation. Patient declined this option and opted for an elective repeat cesarean section. Risks, benefits and alternatives were explained including but not limited to risks of bleeding, infection and damage to adjacent organs. Patient verbalized understanding and all questions were answered.

## 2013-06-07 NOTE — Progress Notes (Signed)
Mindy Johnson is a 37 y.o. G3P1011 at [redacted]w[redacted]d admitted for induction of labor due to postdates.  Subjective: Lt lower abdominal pain w/ uc's, some pain still present in between uc's- epidural pcea does relieve pain  Objective: BP 107/45  Pulse 83  Temp(Src) 99.1 F (37.3 C) (Axillary)  Resp 20  Ht 5\' 1"  (1.549 m)  Wt 95.255 kg (210 lb)  BMI 39.7 kg/m2  SpO2 99%  LMP 08/19/2012 I/O last 3 completed shifts: In: -  Out: 700 [Urine:700] Total I/O In: -  Out: 650 [Urine:650]  FHT:  FHR: 155 bpm, variability: moderate,  accelerations:  Abscent,  decelerations:  Present lates vs. earlies- uc's not tracing, iupc now in place UC:   ~2-4 SVE:   7/90/+1 to +2, IUPC placed w/o difficulty, no vb Pos scalp stim  Labs: Lab Results  Component Value Date   WBC 15.1* 06/05/2013   HGB 12.0 06/05/2013   HCT 35.2* 06/05/2013   MCV 88.7 06/05/2013   PLT 289 06/05/2013    Assessment / Plan: IOL d/t postdates, RN having to stop pitocin frequently d/t decels, pitocin currently off, IUPC now placed- discussed w/ Dr. Jolayne Panther will continue to observe, restart pitocin when able  Labor: protracted active phase Preeclampsia:  n/a Fetal Wellbeing:  Category II Pain Control:  Epidural I/D:  n/a Anticipated MOD:  VBAC  [name] 06/07/2013, 12:29 AM

## 2013-06-07 NOTE — Anesthesia Postprocedure Evaluation (Signed)
Anesthesia Post Note  Patient: Mindy Johnson  Procedure(s) Performed: Procedure(s) (LRB): repeat CESAREAN SECTION  of baby girl  at  57  APGAR 5/8 (N/A)  Anesthesia type: Epidural  Patient location: Mother/Baby  Post pain: Pain level controlled  Post assessment: Post-op Vital signs reviewed  Last Vitals:  Filed Vitals:   06/07/13 0830  BP: 112/66  Pulse: 85  Temp:   Resp: 20    Post vital signs: Reviewed  Level of consciousness:alert  Complications: No apparent anesthesia complications Anesthesia Post-op Note  Patient: Mindy Johnson  Procedure(s) Performed: Procedure(s): repeat CESAREAN SECTION  of baby girl  at  21  APGAR 5/8 (N/A)  Patient Location: PACU and Mother/Baby  Anesthesia Type:Epidural  Level of Consciousness: awake and alert   Airway and Oxygen Therapy: Patient Spontanous Breathing  Post-op Pain: none  Post-op Assessment: Post-op Vital signs reviewed  Post-op Vital Signs: Reviewed and stable  Complications: No apparent anesthesia complications
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
## 2013-06-07 NOTE — Anesthesia Postprocedure Evaluation (Signed)
  Anesthesia Post-op Note  Anesthesia Post Note  Patient: Mindy Johnson  Procedure(s) Performed: Procedure(s) (LRB): repeat CESAREAN SECTION  of baby girl  at  41  APGAR 5/8 (N/A)  Anesthesia type: Epidural  Patient location: PACU  Post pain: Pain level controlled  Post assessment: Post-op Vital signs reviewed  Last Vitals:  Filed Vitals:   06/07/13 0631  BP: 114/57  Pulse: 75  Temp: 36.6 C  Resp: 20    Post vital signs: stable  Level of consciousness: awake  Complications: No apparent anesthesia complications

## 2013-06-07 NOTE — OR Nursing (Signed)
25 ml blood loss during fundal massage by DLWegner RN, cord blood x 2 to OR front desk

## 2013-06-07 NOTE — Op Note (Signed)
Mindy Johnson PROCEDURE DATE: 06/05/2013 - 06/07/2013  PREOPERATIVE DIAGNOSIS: Intrauterine pregnancy at  [redacted]w[redacted]d weeks gestation; patient declines vag del attempt and previous uterine incision kerr x 1  POSTOPERATIVE DIAGNOSIS: The same with complete uterine rupture  PROCEDURE:     Cesarean Section  SURGEON:  Dr. Catalina Antigua  ASSISTANT: none  INDICATIONS: Mindy Johnson is a 36 y.o. G3P1011 at [redacted]w[redacted]d scheduled for cesarean section secondary to patient declines vag del attempt.  The patient was undergoing a trial of labor after cesarean section. Due to inability to adequately augment labor with pitocin, secondary to the presence of late decelerations, the patient failed to make further cervical change after 7 cm for 8 hours. The patient elected for a repeat cesarean section. The risks of cesarean section discussed with the patient included but were not limited to: bleeding which may require transfusion or reoperation; infection which may require antibiotics; injury to bowel, bladder, ureters or other surrounding organs; injury to the fetus; need for additional procedures including hysterectomy in the event of a life-threatening hemorrhage; placental abnormalities wth subsequent pregnancies, incisional problems, thromboembolic phenomenon and other postoperative/anesthesia complications. The patient concurred with the proposed plan, giving informed written consent for the procedure.    FINDINGS:  Viable female infant in cephalic presentation.  Apgars 5 and 8.  Thick meconium noted upon entry into peritoneal cavity with visible fetal parts in abdomen. Complete uterine rupture. Intact placenta, three vessel cord.  Normal uterus, fallopian tubes and ovaries bilaterally.  ANESTHESIA:    Epidural INTRAVENOUS FLUIDS:1800 ml ESTIMATED BLOOD LOSS: 800 ml URINE OUTPUT:  300 ml SPECIMENS: Placenta sent to L&D COMPLICATIONS: None immediate  PROCEDURE IN DETAIL:  The patient received intravenous  antibiotics and had sequential compression devices applied to her lower extremities while in the preoperative area.  She was then taken to the operating room where anesthesia was induced and was found to be adequate. A foley catheter was placed into her bladder and attached to Mindy Johnson gravity. She was then placed in a dorsal supine position with a leftward tilt, and prepped and draped in a sterile manner. After an adequate timeout was performed, a Pfannenstiel skin incision was made with scalpel and carried through to the underlying layer of fascia. The fascia was incised in the midline and this incision was extended bilaterally using the Mayo scissors. Kocher clamps were applied to the superior aspect of the fascial incision and the underlying rectus muscles were dissected off bluntly. A similar process was carried out on the inferior aspect of the facial incision. The rectus muscles were separated in the midline bluntly and the peritoneum was entered bluntly. Thick meconium and fetal part were visualized upon entry into the peritoneal cavity. Fetal head was wedged into pelvis. With assistance of nurse, fetal vertex was pushed in lightly via vaginal approach in order to dislodge the head from pelvis. The infant then floated into upper abdomen and was delivered via breech extraction. The cord was clamped and cut and infant was handed over to awaiting neonatology team. Cord pH of 7.3 was obtained. Uterine massage was then administered and the placenta delivered intact with three-vessel cord. The uterus was exteriorized and cleared of clot and debris. A complete uterine rupture was noted and was closed with 0 Vicryl in a running locked fashion, and an imbricating layer was also placed with a 0 Vicryl. Overall, excellent hemostasis was noted. The uterus was returned to the abdomen. The pelvis copiously irrigated and cleared of all clot and debris.  Hemostasis was confirmed on all surfaces.  The peritoneum and the  muscles were reapproximated using 0 vicryl interrupted stitches. The fascia was then closed using 0 Vicryl in a running locked fashion.  The subcutaneous layer was reapproximated with plain gut and the skin was closed in a subcuticular fashion using 3.0 Vicryl. The patient tolerated the procedure well. Sponge, lap, instrument and needle counts were correct x 2. She was taken to the recovery room in stable condition.    Mindy Johnson,PEGGYMD  06/07/2013 5:01 AM

## 2013-06-07 NOTE — Transfer of Care (Signed)
Immediate Anesthesia Transfer of Care Note  Patient: Mindy Johnson  Procedure(s) Performed: Procedure(s): repeat CESAREAN SECTION  of baby girl  at  17  APGAR 5/8 (N/A)  Patient Location: PACU  Anesthesia Type:Epidural  Level of Consciousness: awake  Airway & Oxygen Therapy: Patient Spontanous Breathing  Post-op Assessment: Report given to PACU RN and Post -op Vital signs reviewed and stable  Post vital signs: stable  Complications: No apparent anesthesia complications

## 2013-06-07 NOTE — Progress Notes (Signed)
Mindy Johnson is a 37 y.o. G3P1011 at [redacted]w[redacted]d admitted for induction of labor due to postdates.  Subjective: Comfortable w/o complaints, until changed positions. Now reports constant lower abdominal pain  Objective: BP 103/58  Pulse 90  Temp(Src) 99.7 F (37.6 C) (Axillary)  Resp 20  Ht 5\' 1"  (1.549 m)  Wt 95.255 kg (210 lb)  BMI 39.7 kg/m2  SpO2 100%  LMP 08/19/2012 I/O last 3 completed shifts: In: -  Out: 700 [Urine:700] Total I/O In: -  Out: 650 [Urine:650] Tmax 100.1 axillary   FHT:  FHR: 150 bpm, variability: moderate,  accelerations:  Present,  decelerations:  Present lates and variables UC:   regular, Johnson 2-5 minutes, MVUs inadequate w/ pitocin at 54mu/min SVE:   Dilation: 7 Effacement (%): 90 Station: +1 Exam by:: [employer] CNM  Labs: Lab Results  Component Value Date   WBC 15.1* 06/05/2013   HGB 12.0 06/05/2013   HCT 35.2* 06/05/2013   MCV 88.7 06/05/2013   PLT 289 06/05/2013    Assessment / Plan: IOL d/t postdates, no cervical change since 1900, unable to titrate pitocin as needed to achieve adequate mvu's d/t decels  Labor: protracted active phase Preeclampsia:  n/a Fetal Wellbeing:  Category II Pain Control:  Epidural I/D:  ampicillin and gentamicin d/t presumed chorioamnionitis Anticipated MOD:  decision made for RLTCS after discussion w/ pt by Dr. Truitt Leep, Mindy Johnson 06/07/2013, 3:16 AM

## 2013-06-08 ENCOUNTER — Encounter (HOSPITAL_COMMUNITY): Payer: Self-pay | Admitting: Obstetrics and Gynecology

## 2013-06-08 DIAGNOSIS — Z98891 History of uterine scar from previous surgery: Secondary | ICD-10-CM

## 2013-06-08 LAB — CBC
HCT: 30.2 % — ABNORMAL LOW (ref 36.0–46.0)
Hemoglobin: 10 g/dL — ABNORMAL LOW (ref 12.0–15.0)
RBC: 3.44 MIL/uL — ABNORMAL LOW (ref 3.87–5.11)
WBC: 25.3 10*3/uL — ABNORMAL HIGH (ref 4.0–10.5)

## 2013-06-08 NOTE — Progress Notes (Signed)
UR chart review completed.  

## 2013-06-08 NOTE — Progress Notes (Signed)
Subjective: Postpartum Day 1: Cesarean Delivery Patient reports incisional pain, tolerating PO and no problems voiding.    Objective: Vital signs in last 24 hours: Temp:  [97.3 F (36.3 C)-98.4 F (36.9 C)] 98 F (36.7 C) (07/21 0450) Pulse Rate:  [76-115] 81 (07/21 0450) Resp:  [16-20] 18 (07/21 0450) BP: (90-112)/(58-68) 93/61 mmHg (07/21 0450) SpO2:  [93 %-95 %] 95 % (07/21 0450)  Physical Exam:  General: alert, cooperative and no distress Lochia: appropriate Uterine Fundus: firm Incision: no significant drainage, pressure dressing in place DVT Evaluation: No evidence of DVT seen on physical exam. Negative Homan's sign. No cords or calf tenderness. No significant calf/ankle edema.   Recent Labs  06/07/13 1145 06/08/13 0545  HGB 11.9* 10.0*  HCT 35.1* 30.2*    Assessment/Plan: Status post Cesarean section. Doing well postoperatively.  Continue current care. Pressure dressing off and shower today Breastfeeding, plans IUD for contraception  Napoleon Form 06/08/2013, 7:58 AM

## 2013-06-09 NOTE — Discharge Summary (Signed)
Obstetric Discharge Summary Reason for Admission: induction of labor Prenatal Procedures: none Intrapartum Procedures: cesarean: low cervical, transverse Postpartum Procedures: none Complications-Operative and Postpartum: Chorioamnionitis , complete uterine rupture  For complete details of hospital course, please see H&P and c/section op note.  Briefly Pt is a 37 yo G3P2012 who came in for post dates induction and TOLAC.  Her TOLAC failed and she was taken to the OR on 7/20 for RLTCS.  At time of c/s it was determined she had a complete uterine rupture. Her course was further complicated by chorioamnionitis that resolved after placenta delivery and she has remained afebrile off antibiotics. Her newborn has done remarkably well. [facility] and desires an IUD at that time.     Hemoglobin  Date Value Range Status  06/08/2013 10.0* 12.0 - 15.0 g/dL Final     HCT  Date Value Range Status  06/08/2013 30.2* 36.0 - 46.0 % Final    Physical Exam:  General: alert, cooperative and no distress Lochia: appropriate Uterine Fundus: firm Incision: healing well, no significant drainage, no dehiscence, no significant erythema DVT Evaluation: No evidence of DVT seen on physical exam.  Discharge Diagnoses: Post-date pregnancy  Discharge Information: Date: 06/10/2013 Activity: pelvic rest Diet: routine Medications: PNV, Ibuprofen and Percocet Condition: stable Instructions: refer to practice specific booklet Discharge to: home   Newborn Data: Live born female  Birth Weight: 7 lb 1.6 oz (3220 g) APGAR: 5, 8  Home with mother.  Chrissie Noa 06/09/2013, 7:32 AM  I have seen and examined this patient and agree with above documentation in the PA's note. I have made changes as needed.   Rulon Abide, M.D. [facility] Fellow 06/10/2013 7:46 AM

## 2013-06-10 MED ORDER — OXYCODONE-ACETAMINOPHEN 5-325 MG PO TABS: 1.0000 | ORAL_TABLET | ORAL | Status: DC | PRN

## 2013-06-10 MED ORDER — LANOLIN HYDROUS EX OINT: 1.0000 "application " | TOPICAL_OINTMENT | CUTANEOUS | Status: DC | PRN

## 2013-06-10 MED ORDER — IBUPROFEN 600 MG PO TABS: 600.0000 mg | ORAL_TABLET | Freq: Four times a day (QID) | ORAL | Status: DC
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
## 2014-01-13 ENCOUNTER — Ambulatory Visit: Payer: Self-pay | Admitting: Emergency Medicine

## 2014-01-13 VITALS — BP 102/60 | HR 98 | Temp 97.7°F | Resp 16 | Ht 62.0 in | Wt 213.0 lb

## 2014-01-13 DIAGNOSIS — A499 Bacterial infection, unspecified: Secondary | ICD-10-CM

## 2014-01-13 DIAGNOSIS — N760 Acute vaginitis: Secondary | ICD-10-CM

## 2014-01-13 DIAGNOSIS — R102 Pelvic and perineal pain: Secondary | ICD-10-CM

## 2014-01-13 DIAGNOSIS — B9689 Other specified bacterial agents as the cause of diseases classified elsewhere: Secondary | ICD-10-CM

## 2014-01-13 DIAGNOSIS — R300 Dysuria: Secondary | ICD-10-CM

## 2014-01-13 DIAGNOSIS — N949 Unspecified condition associated with female genital organs and menstrual cycle: Secondary | ICD-10-CM

## 2014-01-13 LAB — POCT URINALYSIS DIPSTICK
Bilirubin, UA: NEGATIVE
Blood, UA: NEGATIVE
Glucose, UA: NEGATIVE
Ketones, UA: NEGATIVE
Leukocytes, UA: NEGATIVE
NITRITE UA: NEGATIVE
PROTEIN UA: NEGATIVE
SPEC GRAV UA: 1.025
UROBILINOGEN UA: 0.2
pH, UA: 5.5

## 2014-01-13 LAB — POCT WET PREP WITH KOH
KOH Prep POC: NEGATIVE
Trichomonas, UA: NEGATIVE
Yeast Wet Prep HPF POC: NEGATIVE

## 2014-01-13 LAB — POCT UA - MICROSCOPIC ONLY
Casts, Ur, LPF, POC: NEGATIVE
Crystals, Ur, HPF, POC: NEGATIVE
Mucus, UA: NEGATIVE
YEAST UA: NEGATIVE

## 2014-01-13 MED ORDER — METRONIDAZOLE 500 MG PO TABS: 500.0000 mg | ORAL_TABLET | Freq: Two times a day (BID) | ORAL | Status: DC

## 2014-01-13 MED ORDER — DOXYCYCLINE HYCLATE 100 MG PO CAPS: 100.0000 mg | ORAL_CAPSULE | Freq: Two times a day (BID) | ORAL | Status: DC

## 2014-01-13 MED ORDER — CEFTRIAXONE SODIUM 1 G IJ SOLR
250.0000 mg | Freq: Once | INTRAMUSCULAR | Status: AC
  Administered 2014-01-13: 250 mg via INTRAMUSCULAR

## 2014-01-13 NOTE — Progress Notes (Signed)
[facility] [address], [place] [id number] [phone]  Date:  01/13/2014   Name:  Mindy RichesMaria Johnson   DOB:  [birth year]   MRN:  [id number]  PCP:  No PCP Per Patient    Chief Complaint: Back Pain, Abdominal Pain and Urinary Tract Infection   History of Present Illness:  Mindy Johnson is a 38 y.o. very pleasant female patient who presents with the following:  1 week history of back pain and now has suprapubic pain.  Has dysuria, urgency and frequency.  No fever or chills.  Now has brown vaginal discharge and dyspareunia.  G3P2A1.  Pain in back is non radiating.  No stool change, nausea or vomiting.  No improvement with over the counter medications or other home remedies. Denies other complaint or health concern today.   Uses [facility]  There are no active problems to display for this patient.   No past medical history on file.  No past surgical history on file.  History  Substance Use Topics  . Smoking status: Never Smoker   . Smokeless tobacco: Not on file  . Alcohol Use: No    No family history on file.  No Known Allergies  Medication list has been reviewed and updated.  No current outpatient prescriptions on file prior to visit.   No current facility-administered medications on file prior to visit.    Review of Systems:  As per HPI, otherwise negative.    Physical Examination: Filed Vitals:   01/13/14 1937  BP: 102/60  Pulse: 98  Temp: 97.7 F (36.5 C)  Resp: 16   Filed Vitals:   01/13/14 1937  Height: 5\' 2"  (1.575 m)  Weight: 213 lb (96.616 kg)   Body mass index is 38.95 kg/(m^2). Ideal Body Weight: Weight in (lb) to have BMI = 25: 136.4   GEN: WDWN, NAD, Non-toxic, Alert & Oriented x 3 HEENT: Atraumatic, Normocephalic.  Ears and Nose: No external deformity. EXTR: No clubbing/cyanosis/edema NEURO: Normal gait.  PSYCH: Normally interactive. Conversant. Not depressed or anxious appearing.  Calm demeanor.   Pelvic -  normal external genitalia, vulva, vagina thick white discharge, cervix, uterus and adnexa moderate adnexal tenderness, exam chaperoned by Angie McFarland     Assessment and Plan: BV Pelvic pain Flagyl Rocephin Doxy  Signed,  Phillips OdorJeffery Brookie Wayment, MD  Results for orders placed in visit on 01/13/14  POCT URINALYSIS DIPSTICK      Result Value Ref Range   Color, UA yellow     Clarity, UA clear     Glucose, UA neg     Bilirubin, UA neg     Ketones, UA neg     Spec Grav, UA 1.025     Blood, UA neg     pH, UA 5.5     Protein, UA neg     Urobilinogen, UA 0.2     Nitrite, UA neg     Leukocytes, UA Negative    POCT UA - MICROSCOPIC ONLY      Result Value Ref Range   WBC, Ur, HPF, POC 0-2     RBC, urine, microscopic 0-1     Bacteria, U Microscopic trace     Mucus, UA neg     Epithelial cells, urine per micros 2-5     Crystals, Ur, HPF, POC neg     Casts, Ur, LPF, POC neg     Yeast, UA neg      Results for orders placed in visit on 01/13/14  POCT URINALYSIS DIPSTICK      Result Value Ref Range   Color, UA yellow     Clarity, UA clear     Glucose, UA neg     Bilirubin, UA neg     Ketones, UA neg     Spec Grav, UA 1.025     Blood, UA neg     pH, UA 5.5     Protein, UA neg     Urobilinogen, UA 0.2     Nitrite, UA neg     Leukocytes, UA Negative    POCT UA - MICROSCOPIC ONLY      Result Value Ref Range   WBC, Ur, HPF, POC 0-2     RBC, urine, microscopic 0-1     Bacteria, U Microscopic trace     Mucus, UA neg     Epithelial cells, urine per micros 2-5     Crystals, Ur, HPF, POC neg     Casts, Ur, LPF, POC neg     Yeast, UA neg    POCT WET PREP WITH KOH      Result Value Ref Range   Trichomonas, UA Negative     Clue Cells Wet Prep HPF POC 0-2     Epithelial Wet Prep HPF POC 3-6     Yeast Wet Prep HPF POC neg     Bacteria Wet Prep HPF POC 1+     RBC Wet Prep HPF POC 0-1     WBC Wet Prep HPF POC 5-8     KOH Prep POC Negative

## 2014-01-13 NOTE — Patient Instructions (Signed)
Vaginosis bacteriana  (Bacterial Vaginosis)  La vaginosis bacteriana es una infección vaginal que perturba el equilibrio normal de las bacterias que se encuentran en la vagina. Es el resultado de un crecimiento excesivo de ciertas bacterias. Esta es la infección vaginal más frecuente en mujeres en edad reproductiva. El tratamiento es importante para prevenir complicaciones, especialmente en mujeres embarazadas, dado que puede causar un parto prematuro.  CAUSAS   La vaginosis bacteriana se origina por un aumento de bacterias nocivas que, generalmente, están presentes en cantidades más pequeñas en la vagina. Varios tipos diferentes de bacterias pueden causar esta afección. Sin embargo, la causa de su desarrollo no se comprende totalmente.  FACTORES DE RIESGO  Ciertas actividades o comportamientos pueden exponerlo a un mayor riesgo de desarrollar vaginosis bacteriana, entre los que se incluyen:  · Tener una nueva pareja sexual o múltiples parejas sexuales.  · Las duchas vaginales  · El uso del DIU (dispositivo intrauterino) como método anticonceptivo.  El contagio no se produce en baños, por ropas de cama, en piscinas o por contacto con objetos.  SIGNOS Y SÍNTOMAS   Algunas mujeres que padecen vaginosis bacteriana no presentan signos ni síntomas. Los síntomas más comunes son:  · Secreción vaginal de color grisáceo.  · Secreción vaginal con olor similar al pescado, especialmente después de mantener relaciones sexuales.  · Picazón o sensación de ardor en la vagina o la vulva.  · Ardor o dolor al orinar.  DIAGNÓSTICO   Su médico analizará su historia clínica y le examinará la vagina para detectar signos de vaginosis bacteriana. Puede tomarle una muestra de flujo vaginal. Su médico examinará esta muestra con un microscopio para controlar las bacterias y células anormales. También puede realizarse un análisis del pH vaginal.   TRATAMIENTO   La vaginosis bacteriana puede tratarse con antibióticos, en forma de comprimidos o  de crema vaginal. Puede indicarse una segunda tanda de antibióticos si la afección se repite después del tratamiento.   INSTRUCCIONES PARA EL CUIDADO EN EL HOGAR   · Tome solo medicamentos de venta libre o recetados, según las indicaciones del médico.  · Si le han recetado antibióticos, tómelos como se le indicó. Asegúrese de que finaliza la prescripción completa aunque se sienta mejor.  · No mantenga relaciones sexuales hasta completar el tratamiento.  · Comunique a sus compañeros sexuales que sufre una infección vaginal. Deben consultar a su médico y recibir tratamiento si tienen problemas, como picazón o una erupción cutánea leve.  · Practique el sexo seguro usando preservativos y tenga un único compañero sexual.  SOLICITE ATENCIÓN MÉDICA SI:   · Sus síntomas no mejoran después de 3 días de tratamiento.  · Aumenta la secreción o el dolor.  · Tiene fiebre.  ASEGÚRESE DE QUE:   · Comprende estas instrucciones.  · Controlará su afección.  · Recibirá ayuda de inmediato si no mejora o si empeora.  PARA OBTENER MÁS INFORMACIÓN   Centros para el control y la prevención de enfermedades (Centers for Disease Control and Prevention, CDC): www.cdc.gov/std  Asociación Estadounidense de la Salud Sexual (American Sexual Health Association, SHA): www.ashastd.org   Document Released: 02/12/2008 Document Revised: 08/26/2013  ExitCare® Patient Information ©2014 [employer].

## 2014-01-14 ENCOUNTER — Encounter (HOSPITAL_COMMUNITY): Payer: Self-pay | Admitting: Obstetrics and Gynecology

## 2014-01-15 LAB — GC/CHLAMYDIA PROBE AMP
CT Probe RNA: NEGATIVE
GC Probe RNA: NEGATIVE

## 2014-06-11 IMAGING — US US OB DETAIL+14 WK
1 series · 12 of 28 positions shown · non-contrast
Comparison: none

[Series 1: us ob comp less 14 wks · 80 acquisitions, 12 frames shown]
[im 3/80]
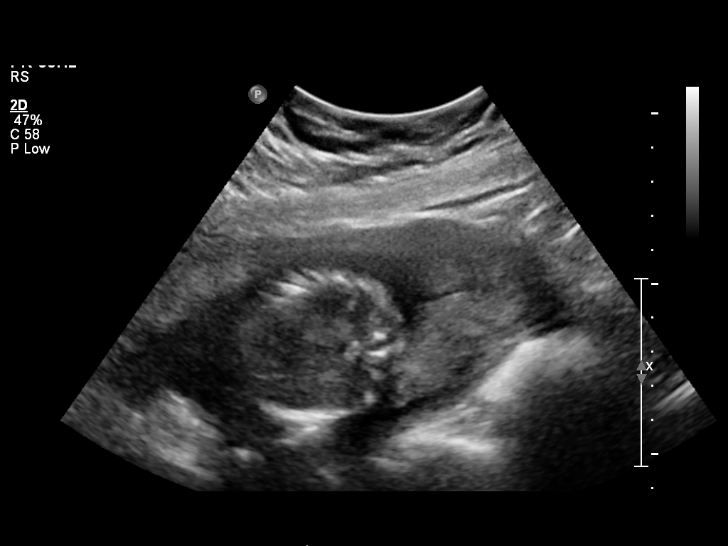
[im 9/80]
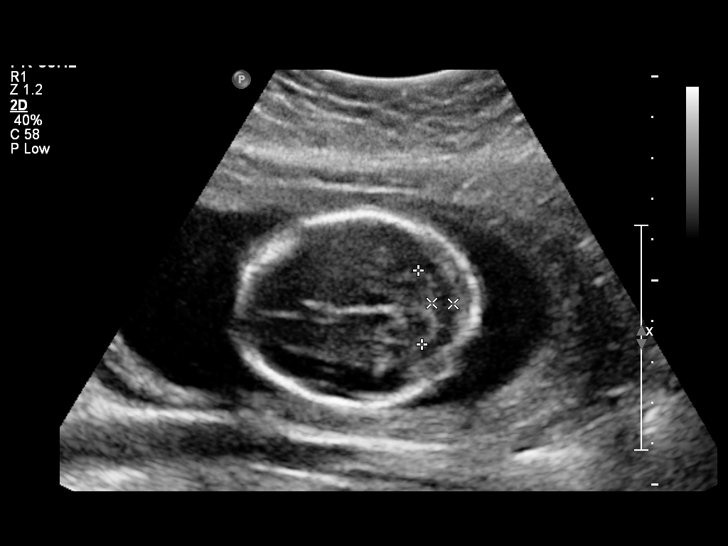
[im 15/80]
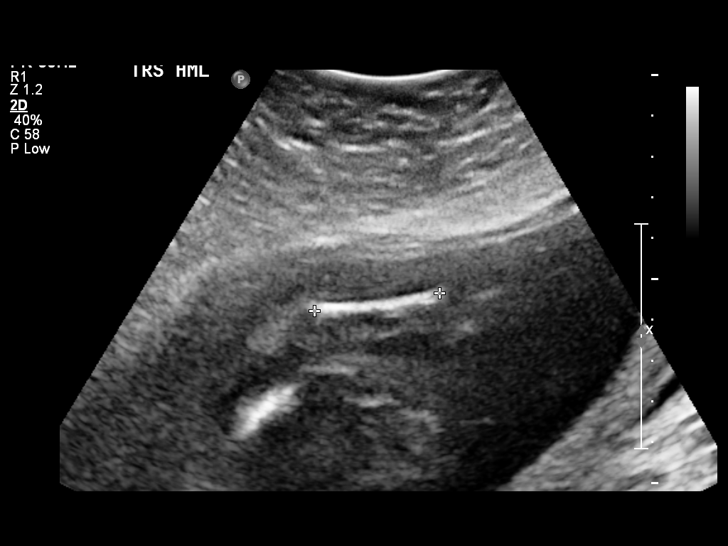
[im 24/80]
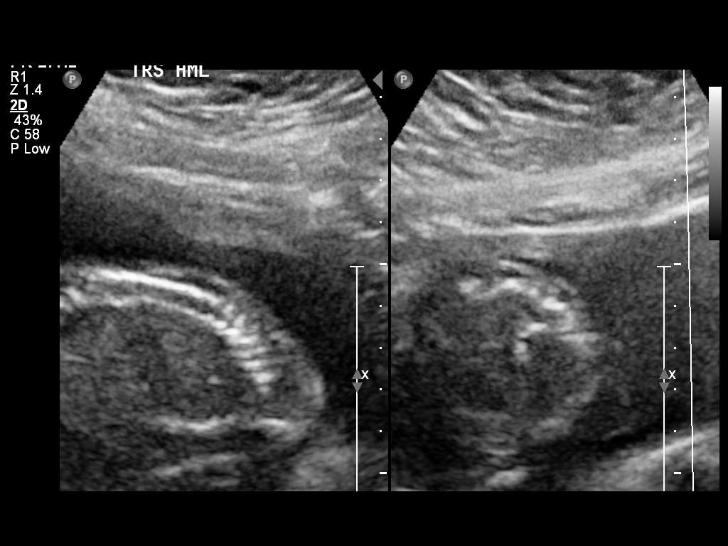
[im 30/80]
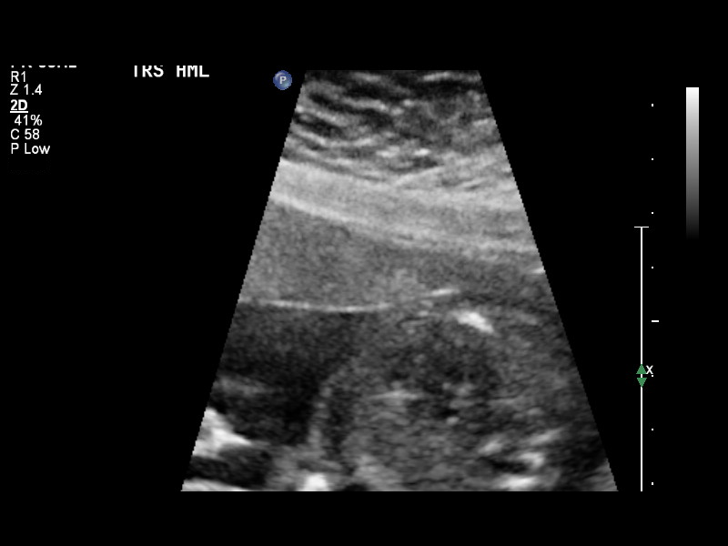
[im 36/80]
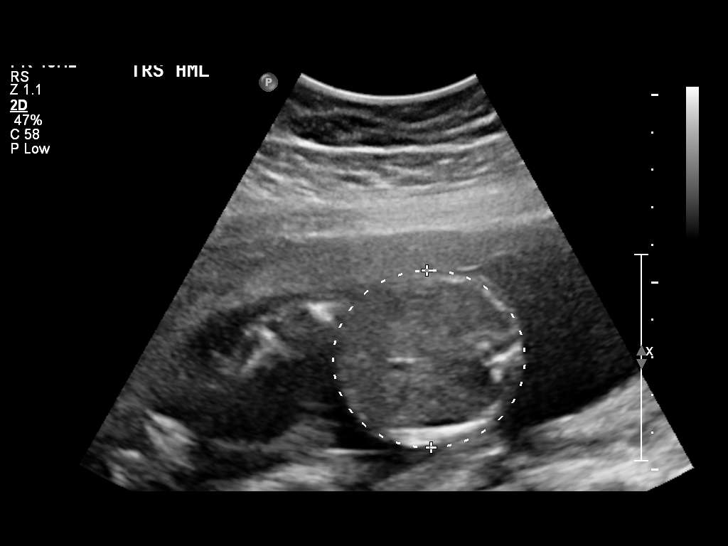
[im 44/80]
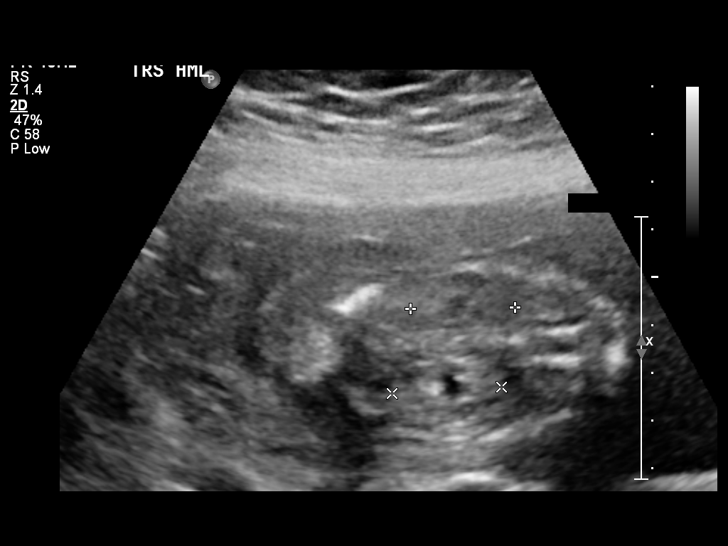
[im 50/80]
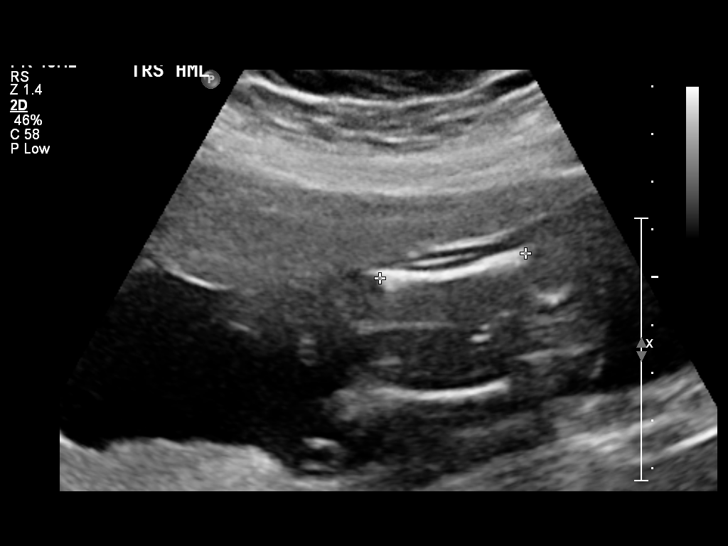
[im 56/80]
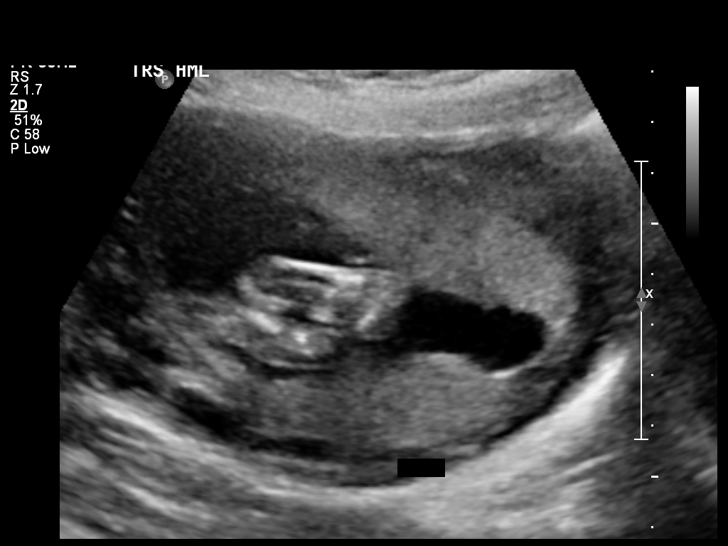
[im 65/80]
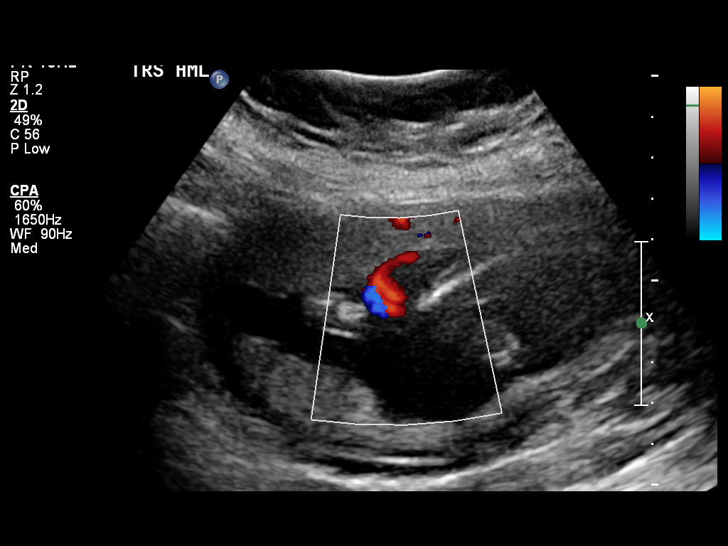
[im 71/80]
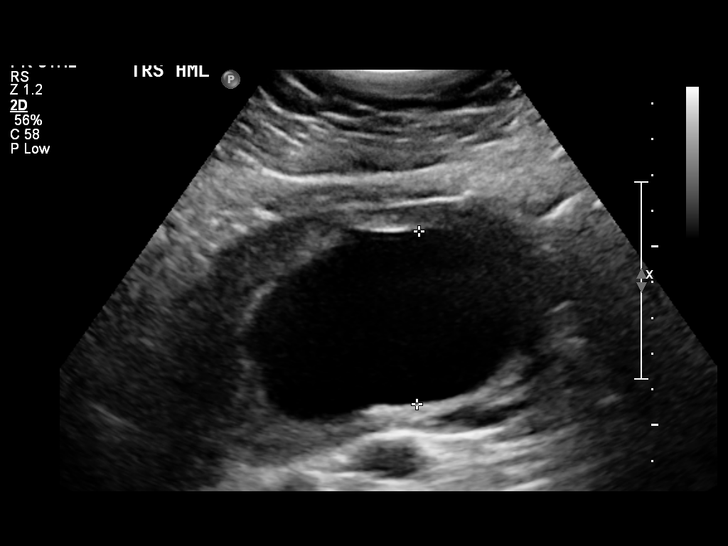
[im 77/80]
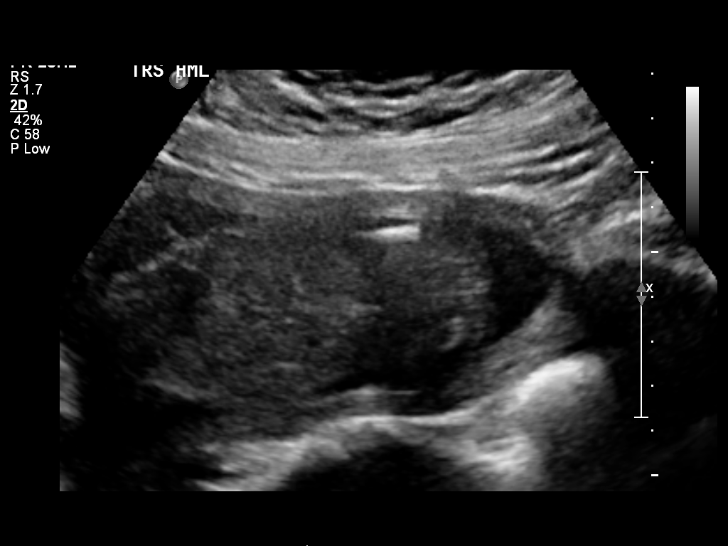

[12 of 28 positions shown; findings below may reference images not displayed]

OBSTETRICS REPORT
                    (Corrected Final 05/01/2013 [DATE])

Service(s) Provided

 US OB DETAIL + 14 WK                                  76811.0
Indications

 Uncertain LMP;  Establish Gestational [AGE]
Fetal Evaluation

 Num Of Fetuses:    1
 Fetal Heart Rate:  140                          bpm
 Cardiac Activity:  Observed
 Presentation:      Variable
 Placenta:          Anterior Fundal, above
                    cervical os
 P. Cord            Marginal insertion
 Insertion:

 Amniotic Fluid
 AFI FV:      Subjectively within normal limits
Biometry

 BPD:     46.7  mm     G. Age:  20w 1d                CI:        72.97   70 - 86
                                                      FL/HC:      18.1   16.8 -

 HC:     173.8  mm     G. Age:  19w 6d       39  %    HC/AC:      1.14   1.09 -

 AC:     152.7  mm     G. Age:  20w 3d       60  %    FL/BPD:
 FL:      31.4  mm     G. Age:  19w 5d       34  %    FL/AC:      20.6   20 - 24
 HUM:     30.4  mm     G. Age:  20w 0d       55  %

 Est. FW:     333  gm    0 lb 12 oz      51  %
Gestational Age

 U/S Today:     20w 0d                                        EDD:   05/26/13
 Best:          20w 0d     Det. By:  U/S (01/06/13)           EDD:   05/26/13
2nd Trimester Genetic Sonogram - Trisomy 21 Screening

 Age:                                             36          Risk=1:   185

 Structural anomalies (inc. cardiac):             N/A
 Echogenic bowel:                                 No
 Hypoplastic / absent midphalanx 5th Digit:       No
 Wide space 7st-7nd toes:                         No
 Pyelectasis:                                     No
 2-vessel umbilical cord:                         No
 Echogenic cardiac foci:                          No

 Incomplete anatomic exam possible
Anatomy

 Cranium:          Appears normal         Aortic Arch:      Not well visualized
 Fetal Cavum:      Appears normal         Ductal Arch:      Not well visualized
 Ventricles:       Appears normal         Diaphragm:        Appears normal
 Choroid Plexus:   Appears normal         Stomach:          Appears normal, left
                                                            sided
 Cerebellum:       Appears normal         Abdomen:          Appears normal
 Posterior Fossa:  Appears normal         Abdominal Wall:   Appears nml (cord
                                                            insert, abd wall)
 Nuchal Fold:      Not applicable (>20    Cord Vessels:     Appears normal (3
                   wks GA)                                  vessel cord)
 Face:             Orbits appear          Kidneys:          Appear normal
                   normal
 Lips:             Appears normal         Bladder:          Appears normal
 Heart:            Not well visualized    Spine:            Appears normal
 RVOT:             Not well visualized    Lower             Appears normal
                                          Extremities:
 LVOT:             Not well visualized    Upper             Appears normal
                                          Extremities:

 Other:  Heels and 5th digit appear normal. Fetus appears to be a female.
         Technically difficult due to maternal habitus and fetal position.
Cervix Uterus Adnexa

 Cervical Length:    4        cm

 Cervix:       Normal appearance by transabdominal scan.
 Uterus:       ? Septate.
 Cul De Sac:   No free fluid seen.

 Left Ovary:    Not visualized.
 Right Ovary:   Not visualized.
Impression

 Siup demonstrating an EGA by ultrasound of 20w 0d. Fetal
 parameters correlate well with this composite EGA.

 Overall anatomic assessment is compromised by maternal
 habitus which diminishes resolution. Visualized fetal anatomy
 appears normal. The fetal heart is insufficiently resolved for
 evaluation and the profile is not seen today. Follow up
 evaluation can be performed for hopeful improvement in
 anatomic assessment. No soft markers for Down Syndrome
 are seen associated with visualized anatomy.

 Marginal cord insertion site. Follow up to assess for
 appropriate linear growth is recommended in the late second
 TM given this finding.

 Transverse fundal morphology of the gestational sac raises
 the possibility of a septate uterus. Review of prior exams
 does not clarify this possibility.

 Subjectively and quantitatively normal amniotic fluid volume.
 Normal cervical length.
Recommendations

 Follow-up to assess for appropriate linear growth is
 recommended in the late second trimester.

                 Attending Physician, LAAOUINA

## 2014-07-26 IMAGING — US US OB FOLLOW-UP
1 series · 12 of 28 positions shown · non-contrast
Comparison: none

[Series 1: us ob follow up · 38 acquisitions, 12 frames shown]
[im 2/38]
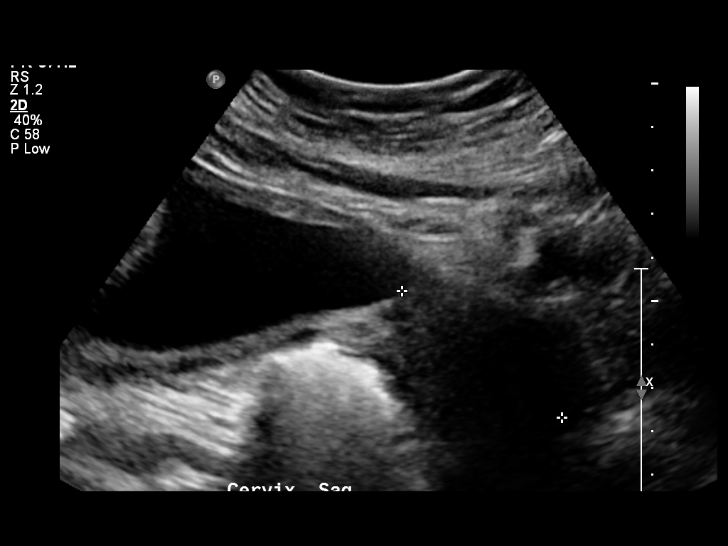
[im 5/38]
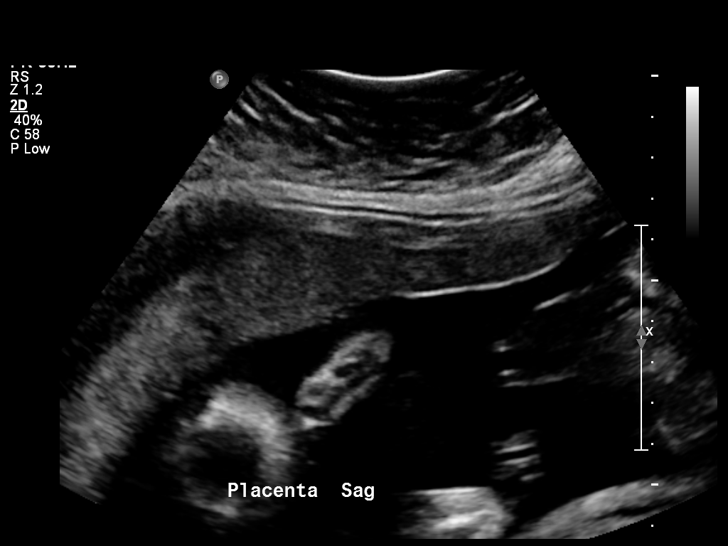
[im 7/38]
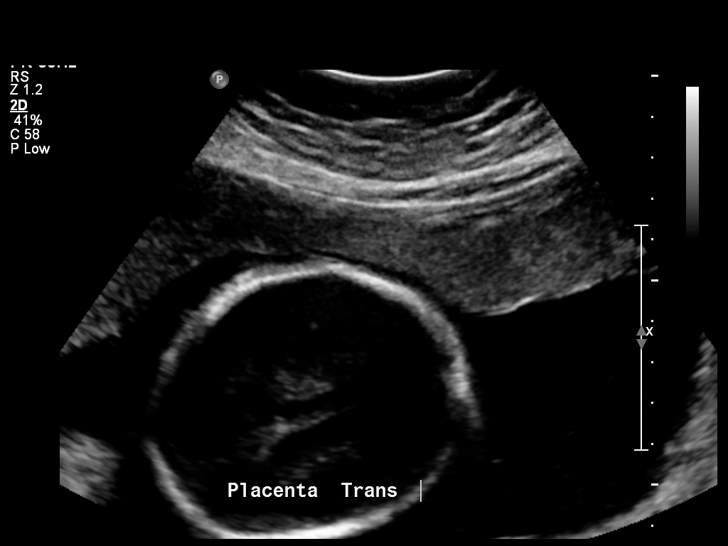
[im 11/38]
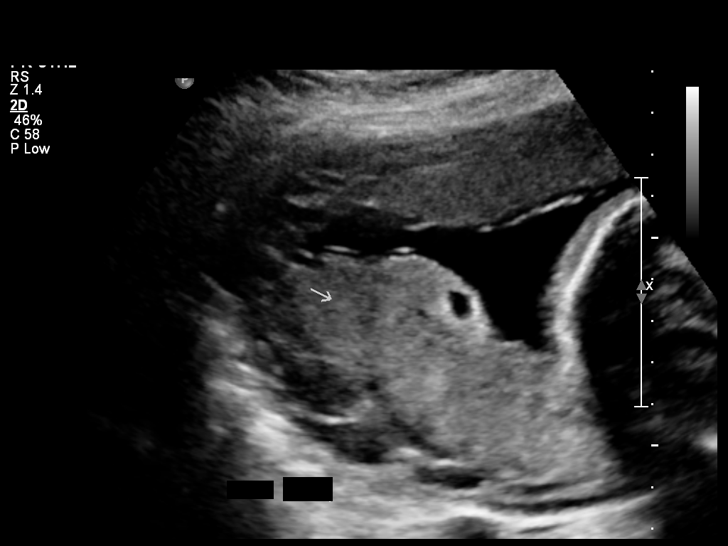
[im 14/38]
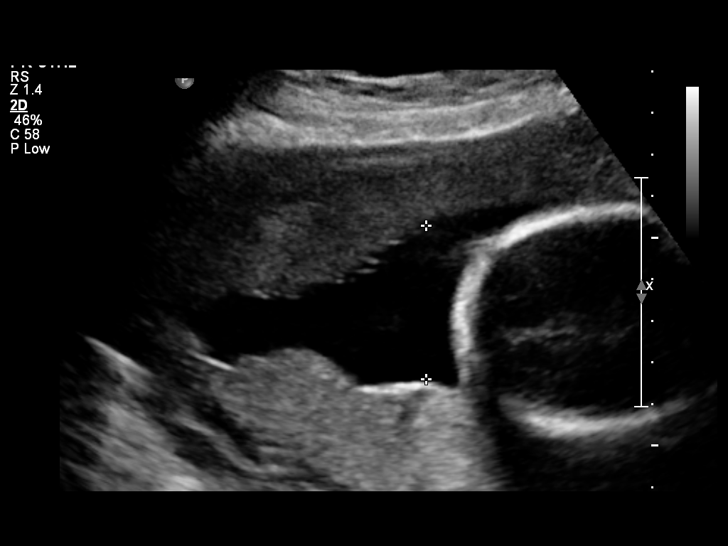
[im 17/38]
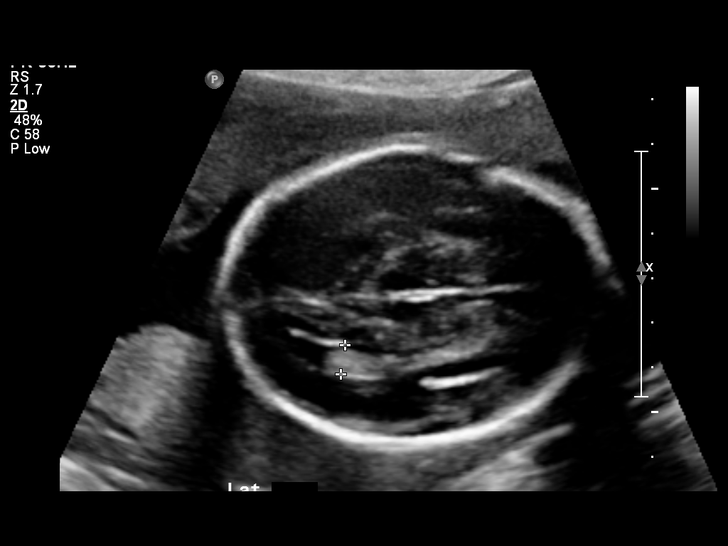
[im 21/38]
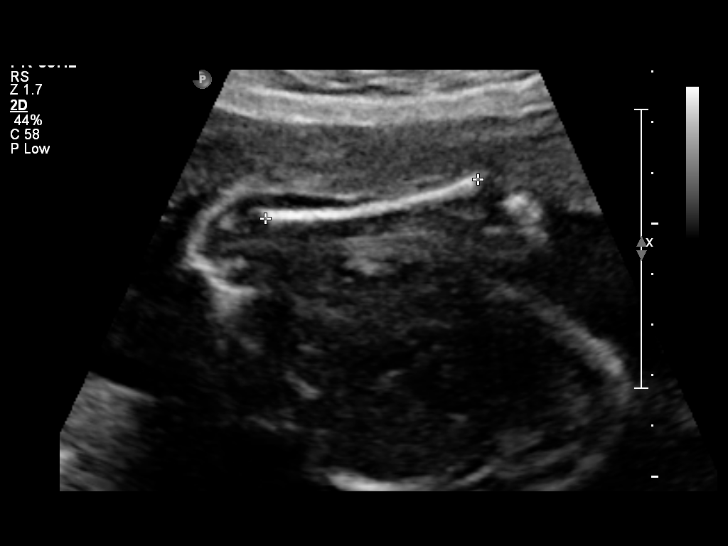
[im 24/38]
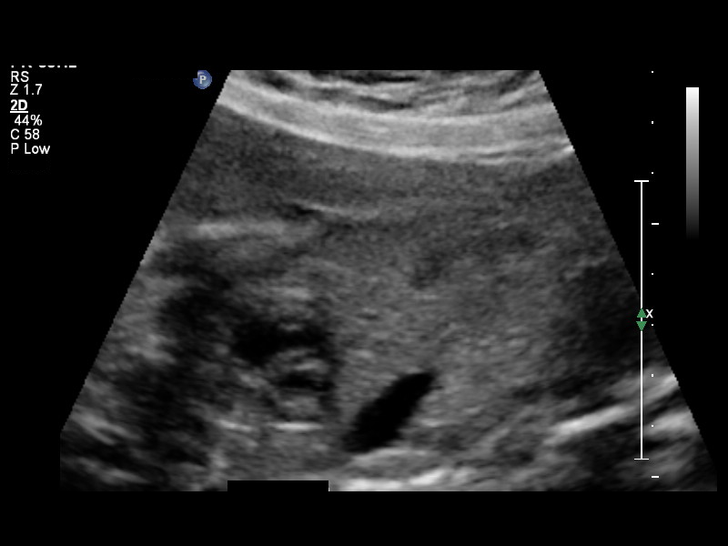
[im 27/38]
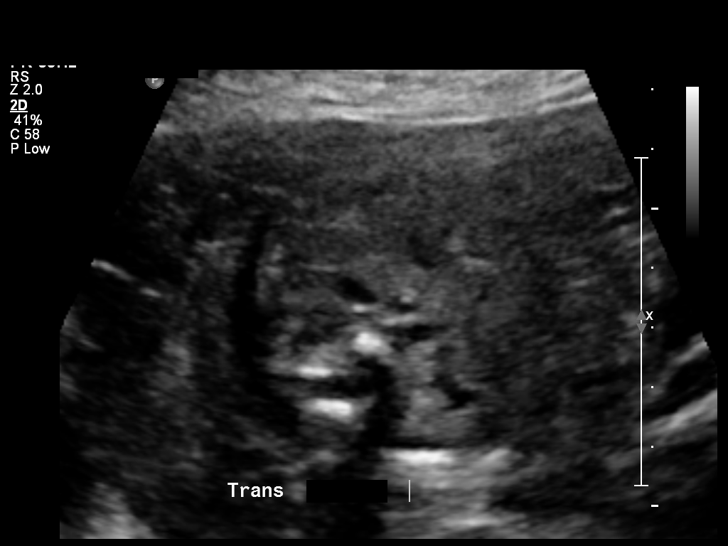
[im 31/38]
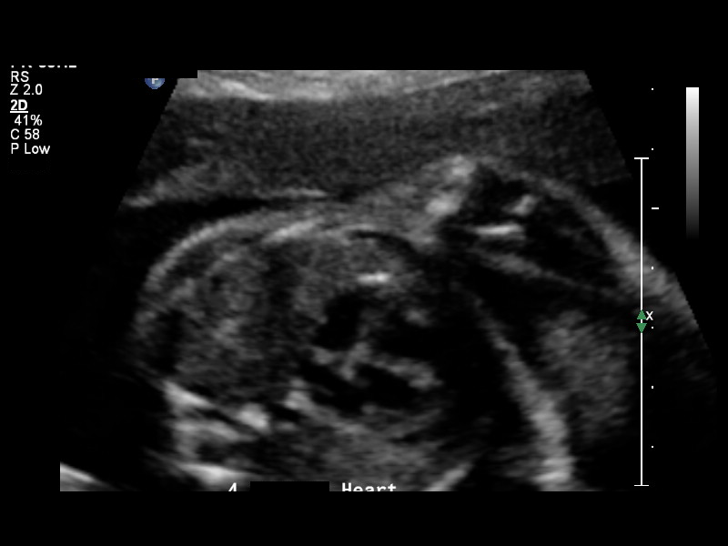
[im 33/38]
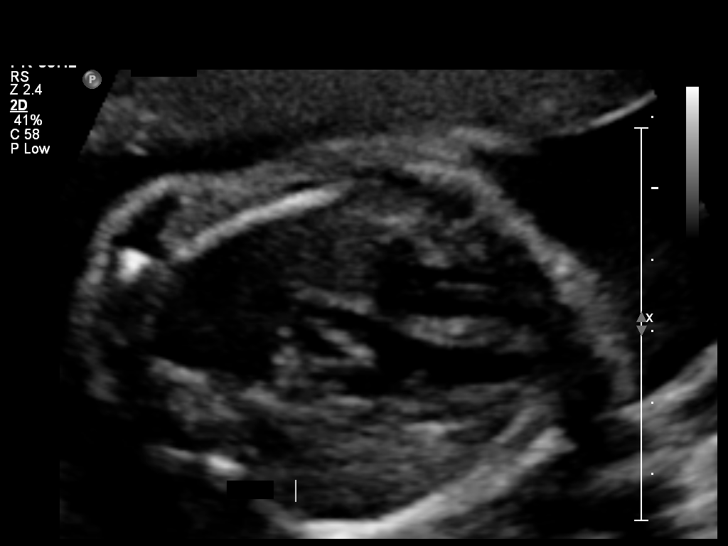
[im 36/38]
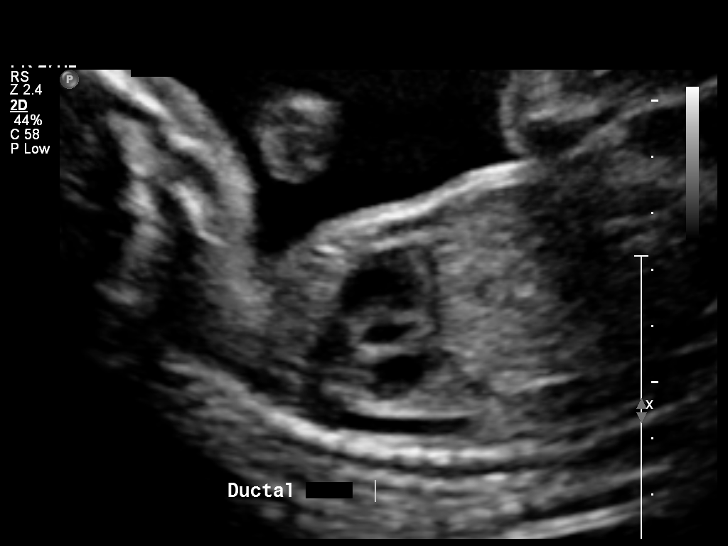

[12 of 28 positions shown; findings below may reference images not displayed]

OBSTETRICS REPORT
                      (Signed Final 02/20/2013 [DATE])

Service(s) Provided

 US OB FOLLOW UP                                       76816.1
Indications

 Marginal insertion of umbilical cord
 Follow-up incomplete fetal anatomic evaluation
Fetal Evaluation

 Num Of Fetuses:    1
 Fetal Heart Rate:  134                         bpm
 Cardiac Activity:  Observed
 Presentation:      Breech
 Placenta:          Succenturiate lobe
 P. Cord            Marginal insertion
 Insertion:

 Amniotic Fluid
 AFI FV:      Subjectively within normal limits
                                             Larg Pckt:   3.67   cm
 RUQ:   3.67   cm
Biometry

 BPD:     64.4  mm    G. Age:   26w 0d                CI:        73.27   70 - 86
                                                      FL/HC:      19.8   18.6 -

 HC:     239.1  mm    G. Age:   26w 0d       14  %    HC/AC:      1.08   1.04 -

 AC:     222.2  mm    G. Age:   26w 4d       48  %    FL/BPD:     73.4   71 - 87
 FL:      47.3  mm    G. Age:   25w 6d       19  %    FL/AC:      21.3   20 - 24
 HUM:     42.6  mm    G. Age:   25w 4d       25  %
 CER:     28.8  mm    G. Age:   25w 6d       36  %
 Est. FW:     911  gm          2 lb      50  %
Gestational Age

 U/S Today:     26w 1d                                        EDD:   05/28/13
 Best:          26w 3d    Det. By:   U/S    (01/06/13)        EDD:   05/26/13
Anatomy
 Cranium:          Appears normal         Aortic Arch:      Appears normal
 Fetal Cavum:      Appears normal         Ductal Arch:      Appears normal
 Ventricles:       Appears normal         Diaphragm:        Appears normal
 Choroid Plexus:   Previously seen        Stomach:          Appears normal, left
                                                            sided
 Cerebellum:       Appears normal         Abdomen:          Appears normal
 Posterior Fossa:  Appears normal         Abdominal Wall:   Previously seen
 Nuchal Fold:      Not applicable (>20    Cord Vessels:     Previously seen
                   wks GA)
 Face:             Profile appears        Kidneys:          Appear normal
                   normal, orbits
                   previously
 Lips:             Previously seen        Bladder:          Appears normal
 Heart:            Appears normal         Spine:            Previously seen
                   (4CH, axis, and
                   situs)
 RVOT:             Appears normal         Lower             Previously seen
                                          Extremities:
 LVOT:             Appears normal         Upper             Previously seen
                                          Extremities:

 Other:  Heels and 5th digit previously seen.
Targeted Anatomy

 Fetal Central Nervous System
 Lat. Ventricles:  6.6                    Cisterna Magna:
Cervix Uterus Adnexa

 Cervical Length:   4.67      cm

 Cervix:       Normal appearance by transabdominal scan.
 Uterus:       No abnormality visualized.
 Cul De Sac:   No free fluid seen.

 Adnexa:     No abnormality visualized.
Impression

 Single intrauterine gestation demonstrating an estimated
 gestational age by ultrasound of 26w 1d. This is correlated
 with expected estimated gestational age by prior ultrasound
 of 26w 3d. EFW is currently at the 50%.

 An improved anatomic evaluation of the fetal heart and profile
 were possible today. Visualized anatomy appears normal.

 A posterior succenturiate lobe is seen today. This was
 previously felt to be attached to the main placental body by a
 thin placental bridge, but today appears separate. The cord
 inserts marginally on the main placental body.

 Subjectively and quantitatively normal amniotic fluid volume.

 Normal cervical length and appearance.

## 2014-09-20 ENCOUNTER — Encounter (HOSPITAL_COMMUNITY): Payer: Self-pay | Admitting: Obstetrics and Gynecology

## 2017-12-13 ENCOUNTER — Ambulatory Visit (INDEPENDENT_AMBULATORY_CARE_PROVIDER_SITE_OTHER): Payer: Self-pay | Admitting: Family Medicine

## 2017-12-13 DIAGNOSIS — Z5321 Procedure and treatment not carried out due to patient leaving prior to being seen by health care provider: Secondary | ICD-10-CM
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
## 2017-12-16 ENCOUNTER — Other Ambulatory Visit (HOSPITAL_COMMUNITY): Payer: Self-pay | Admitting: *Deleted

## 2017-12-16 DIAGNOSIS — N6320 Unspecified lump in the left breast, unspecified quadrant: Secondary | ICD-10-CM

## 2017-12-26 ENCOUNTER — Ambulatory Visit (HOSPITAL_COMMUNITY)
Admission: RE | Admit: 2017-12-26 | Discharge: 2017-12-26 | Disposition: A | Discharge status: Home / Self Care | Payer: Self-pay | Source: Ambulatory Visit | Attending: Obstetrics and Gynecology | Admitting: Obstetrics and Gynecology

## 2017-12-26 ENCOUNTER — Ambulatory Visit
Admission: RE | Admit: 2017-12-26 | Discharge: 2017-12-26 | Disposition: A | Discharge status: Home / Self Care | Payer: No Typology Code available for payment source | Source: Ambulatory Visit | Attending: Obstetrics and Gynecology | Admitting: Obstetrics and Gynecology

## 2017-12-26 ENCOUNTER — Encounter (HOSPITAL_COMMUNITY): Payer: Self-pay

## 2017-12-26 VITALS — BP 112/70 | Wt 215.2 lb

## 2017-12-26 DIAGNOSIS — N644 Mastodynia: Secondary | ICD-10-CM

## 2017-12-26 DIAGNOSIS — Z01419 Encounter for gynecological examination (general) (routine) without abnormal findings: Secondary | ICD-10-CM

## 2017-12-26 DIAGNOSIS — N6320 Unspecified lump in the left breast, unspecified quadrant: Secondary | ICD-10-CM

## 2017-12-26 NOTE — Patient Instructions (Signed)
Explained breast self awareness with [name]. Let patient know [facility] will cover Pap smears and HPV typing every 5 years unless has a history of abnormal Pap smears. [facility] and possible left breast ultrasound. Appointment scheduled for Thursday, December 26, 2017 at 1450. Let patient know will follow up with her within the next couple weeks with results of Pap smear by letter or phone. Edilia BoMaria F Johnson verbalized understanding.  Jayln Madeira, Kathaleen Maserhristine Poll, RN 2:33 PM

## 2017-12-26 NOTE — Progress Notes (Signed)
Complaints of left outer breast pain x 4 months that comes and goes. Patient rates the pain at a 6 out of 10.  Pap Smear: Pap smear completed today. [facility] and normal per patient. No Pap smear results are in Epic.  Physical exam: Breasts Breasts symmetrical. No skin abnormalities bilateral breasts. No nipple retraction bilateral breasts. No nipple discharge bilateral breasts. No lymphadenopathy. No lumps palpated bilateral breasts. Complaints of left outer breast tenderness on exam. [facility] mamm[facility] and possible left breast ultrasound. Appointment scheduled for Thursday, December 26, 2017 at 1450.  Pelvic/Bimanual   Ext Genitalia No lesions, no swelling and no discharge observed on external genitalia.         Vagina Vagina pink and normal texture. No lesions or discharge observed in vagina.          Cervix Cervix is present. Cervix pink and of normal texture. No discharge observed.     Uterus Uterus is present and palpable. Uterus in normal position and normal size.        Adnexae Bilateral ovaries present and palpable. No tenderness on palpation.          Rectovaginal No rectal exam completed today since patient had no rectal complaints. No skin abnormalities observed on exam.    Smoking History: Patient has never smoked.  Patient Navigation: Patient education provided. Access to services provided for [facility] through [facility]. Spanish interpreter provided.  Used Spanish interpreter [employer] Gal[employer]o from [place].

## 2017-12-27 ENCOUNTER — Encounter (HOSPITAL_COMMUNITY): Payer: Self-pay | Admitting: *Deleted

## 2017-12-27 LAB — CYTOLOGY - PAP
Diagnosis: NEGATIVE
HPV: NOT DETECTED

## 2017-12-28 NOTE — Progress Notes (Signed)
This patient left without being seen.

## 2017-12-31 ENCOUNTER — Encounter (HOSPITAL_COMMUNITY): Payer: Self-pay | Admitting: *Deleted

## 2017-12-31 NOTE — Progress Notes (Signed)
Letter mailed with negative pap smear results. Next pap smear due in five years.

## 2018-01-27 ENCOUNTER — Encounter (HOSPITAL_COMMUNITY): Payer: Self-pay | Admitting: *Deleted

## 2018-01-27 NOTE — Progress Notes (Signed)
Letter mailed to patient with negative pap smear results. HPV was negative. Next pap smear due in five years. 

## 2019-09-04 ENCOUNTER — Ambulatory Visit: Payer: Self-pay | Admitting: Obstetrics & Gynecology

## 2019-10-13 ENCOUNTER — Ambulatory Visit: Payer: Self-pay | Admitting: Obstetrics & Gynecology

## 2019-10-13 DIAGNOSIS — Z0289 Encounter for other administrative examinations: Secondary | ICD-10-CM

## 2023-03-06 ENCOUNTER — Encounter (HOSPITAL_COMMUNITY): Payer: Self-pay

## 2023-03-06 ENCOUNTER — Ambulatory Visit (HOSPITAL_COMMUNITY)
Admission: EM | Admit: 2023-03-06 | Discharge: 2023-03-06 | Disposition: A | Discharge status: Home / Self Care | Payer: No Typology Code available for payment source | Attending: Emergency Medicine | Admitting: Emergency Medicine

## 2023-03-06 ENCOUNTER — Ambulatory Visit (INDEPENDENT_AMBULATORY_CARE_PROVIDER_SITE_OTHER): Payer: No Typology Code available for payment source

## 2023-03-06 DIAGNOSIS — N393 Stress incontinence (female) (male): Secondary | ICD-10-CM

## 2023-03-06 DIAGNOSIS — J309 Allergic rhinitis, unspecified: Secondary | ICD-10-CM

## 2023-03-06 DIAGNOSIS — J209 Acute bronchitis, unspecified: Secondary | ICD-10-CM

## 2023-03-06 DIAGNOSIS — R0602 Shortness of breath: Secondary | ICD-10-CM

## 2023-03-06 MED ORDER — ALBUTEROL SULFATE HFA 108 (90 BASE) MCG/ACT IN AERS: 1.0000 | INHALATION_SPRAY | Freq: Four times a day (QID) | RESPIRATORY_TRACT | 0 refills | Status: AC | PRN

## 2023-03-06 MED ORDER — PROMETHAZINE-DM 6.25-15 MG/5ML PO SYRP: 5.0000 mL | ORAL_SOLUTION | Freq: Four times a day (QID) | ORAL | 0 refills | Status: AC | PRN

## 2023-03-06 MED ORDER — PREDNISONE 20 MG PO TABS: 40.0000 mg | ORAL_TABLET | Freq: Every day | ORAL | 0 refills | Status: AC
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
## 2023-03-06 NOTE — ED Triage Notes (Signed)
Pt c/o cough x15days and for the past week having chest tightness radiating through to back. States has been taking allergy meds with no relief and used her daughters albuterol inhaler with relief.

## 2023-03-06 NOTE — Discharge Instructions (Signed)
Please use the inhaler as needed for shortness of breath.  Please start the steroids tomorrow, take them with breakfast.  You can use the cough syrup as needed at night, do not drink or drive on this medication as it may make you drowsy.  Please return to clinic if you develop any new or concerning symptoms, high fever, productive cough, or any changes.  For your incontinence issues you can try Kegel exercises. You can look up videos on how to perform these.  If your incontinence continues, I suggest following up with a pelvic floor therapist.

## 2024-05-20 ENCOUNTER — Ambulatory Visit: Payer: Self-pay | Admitting: Family Medicine

## 2024-06-03 ENCOUNTER — Ambulatory Visit: Payer: Self-pay
